# Patient Record
Sex: Male | Born: 1990 | Race: White | Hispanic: No | Marital: Single | Smoking: Former smoker
Health system: Southern US, Community
[De-identification: ages and names within clinical notes are randomized; demographics above are authoritative.]

## PROBLEM LIST (undated history)

## (undated) HISTORY — PX: APPENDECTOMY: SHX54

---

## 2014-12-23 ENCOUNTER — Encounter (HOSPITAL_COMMUNITY): Payer: Self-pay | Admitting: *Deleted

## 2014-12-23 ENCOUNTER — Emergency Department (HOSPITAL_COMMUNITY)
Admission: EM | Admit: 2014-12-23 | Discharge: 2014-12-23 | Disposition: A | Discharge status: Home / Self Care | Payer: 59 | Attending: Emergency Medicine | Admitting: Emergency Medicine

## 2014-12-23 DIAGNOSIS — R6883 Chills (without fever): Secondary | ICD-10-CM | POA: Insufficient documentation

## 2014-12-23 DIAGNOSIS — R112 Nausea with vomiting, unspecified: Secondary | ICD-10-CM | POA: Insufficient documentation

## 2014-12-23 DIAGNOSIS — Z720 Tobacco use: Secondary | ICD-10-CM | POA: Insufficient documentation

## 2014-12-23 DIAGNOSIS — R1084 Generalized abdominal pain: Secondary | ICD-10-CM | POA: Insufficient documentation

## 2014-12-23 DIAGNOSIS — R630 Anorexia: Secondary | ICD-10-CM | POA: Diagnosis not present

## 2014-12-23 DIAGNOSIS — Z880 Allergy status to penicillin: Secondary | ICD-10-CM | POA: Insufficient documentation

## 2014-12-23 DIAGNOSIS — R1110 Vomiting, unspecified: Secondary | ICD-10-CM

## 2014-12-23 DIAGNOSIS — R197 Diarrhea, unspecified: Secondary | ICD-10-CM | POA: Diagnosis not present

## 2014-12-23 LAB — CBC WITH DIFFERENTIAL/PLATELET
BASOS ABS: 0 10*3/uL (ref 0.0–0.1)
BASOS PCT: 0 % (ref 0–1)
Eosinophils Absolute: 0.2 10*3/uL (ref 0.0–0.7)
Eosinophils Relative: 1 % (ref 0–5)
HCT: 49.3 % (ref 39.0–52.0)
Hemoglobin: 17.3 g/dL — ABNORMAL HIGH (ref 13.0–17.0)
Lymphocytes Relative: 10 % — ABNORMAL LOW (ref 12–46)
Lymphs Abs: 1.6 10*3/uL (ref 0.7–4.0)
MCH: 32.3 pg (ref 26.0–34.0)
MCHC: 35.1 g/dL (ref 30.0–36.0)
MCV: 92.1 fL (ref 78.0–100.0)
Monocytes Absolute: 0.9 10*3/uL (ref 0.1–1.0)
Monocytes Relative: 6 % (ref 3–12)
Neutro Abs: 12.8 10*3/uL — ABNORMAL HIGH (ref 1.7–7.7)
Neutrophils Relative %: 83 % — ABNORMAL HIGH (ref 43–77)
PLATELETS: 311 10*3/uL (ref 150–400)
RBC: 5.35 MIL/uL (ref 4.22–5.81)
RDW: 12.5 % (ref 11.5–15.5)
WBC: 15.4 10*3/uL — ABNORMAL HIGH (ref 4.0–10.5)

## 2014-12-23 LAB — COMPREHENSIVE METABOLIC PANEL
ALT: 20 U/L (ref 0–53)
AST: 27 U/L (ref 0–37)
Albumin: 5.5 g/dL — ABNORMAL HIGH (ref 3.5–5.2)
Alkaline Phosphatase: 106 U/L (ref 39–117)
Anion gap: 18 — ABNORMAL HIGH (ref 5–15)
BUN: 13 mg/dL (ref 6–23)
CO2: 19 mmol/L (ref 19–32)
Calcium: 10.6 mg/dL — ABNORMAL HIGH (ref 8.4–10.5)
Chloride: 98 mmol/L (ref 96–112)
Creatinine, Ser: 1.03 mg/dL (ref 0.50–1.35)
GFR calc Af Amer: >90 mL/min (ref >90)
GFR calc non Af Amer: >90 mL/min (ref >90)
GLUCOSE: 136 mg/dL — AB (ref 70–99)
POTASSIUM: 3.2 mmol/L — AB (ref 3.5–5.1)
SODIUM: 135 mmol/L (ref 135–145)
Total Bilirubin: 1 mg/dL (ref 0.3–1.2)
Total Protein: 8.3 g/dL (ref 6.0–8.3)

## 2014-12-23 LAB — LIPASE, BLOOD: Lipase: 23 U/L (ref 11–59)

## 2014-12-23 MED ORDER — ONDANSETRON 4 MG PO TBDP
ORAL_TABLET | ORAL | Status: AC
  Filled 2014-12-23: qty 1

## 2014-12-23 MED ORDER — SODIUM CHLORIDE 0.9 % IV BOLUS (SEPSIS)
1000.0000 mL | Freq: Once | INTRAVENOUS | Status: AC
  Administered 2014-12-23: 1000 mL via INTRAVENOUS

## 2014-12-23 MED ORDER — ONDANSETRON 4 MG PO TBDP
4.0000 mg | ORAL_TABLET | Freq: Once | ORAL | Status: AC
  Administered 2014-12-23: 4 mg via ORAL

## 2014-12-23 MED ORDER — ONDANSETRON 4 MG PO TBDP: ORAL_TABLET | ORAL | Status: DC

## 2014-12-23 MED ORDER — ONDANSETRON HCL 4 MG/2ML IJ SOLN
4.0000 mg | Freq: Once | INTRAMUSCULAR | Status: AC
  Administered 2014-12-23: 4 mg via INTRAVENOUS
  Filled 2014-12-23: qty 2

## 2014-12-23 NOTE — ED Notes (Signed)
Pt yelling and laying in floor of triage room, asked multiple times to stay in chair and not lay in the floor, pt refusing to get out of the floor at this time.

## 2014-12-23 NOTE — ED Provider Notes (Signed)
CSN: [id number]     Arrival date & time 12/23/14  0128 History   First MD Initiated Contact with Patient 12/23/14 0425     Chief Complaint  Patient presents with  . N/V/D      (Consider location/radiation/quality/duration/timing/severity/associated sxs/prior Treatment) Patient is a 24 y.o. male presenting with abdominal pain.  Abdominal Pain Pain location:  Generalized Pain quality: cramping   Pain radiates to:  Does not radiate Pain severity:  Moderate Onset quality:  Gradual Duration:  1 day Timing:  Constant Progression:  Unchanged Chronicity:  New Context: suspicious food intake (24 hours ago)   Context: not sick contacts   Relieved by:  Nothing Worsened by:  Nothing tried Ineffective treatments:  None tried Associated symptoms: anorexia, belching, chills, diarrhea, nausea and vomiting   Associated symptoms: no chest pain and no fever     History reviewed. No pertinent past medical history. History reviewed. No pertinent past surgical history. History reviewed. No pertinent family history. History  Substance Use Topics  . Smoking status: Current Every Day Smoker  . Smokeless tobacco: Not on file  . Alcohol Use: Not on file    Review of Systems  Constitutional: Positive for chills. Negative for fever.  Cardiovascular: Negative for chest pain.  Gastrointestinal: Positive for nausea, vomiting, abdominal pain, diarrhea and anorexia.  All other systems reviewed and are negative.     Allergies  Amoxicillin  Home Medications   Prior to Admission medications   Medication Sig Start Date End Date Taking? Authorizing Provider  ondansetron (ZOFRAN ODT) 4 MG disintegrating tablet  ODT q4 hours prn nausea/vomit 12/23/14   Mirian Mo, MD   BP 120/76 mmHg  Pulse 90  Temp(Src) 99.5 F (37.5 C) (Oral)  Resp 14  Wt 140 lb (63.504 kg)  SpO2 97% Physical Exam  Constitutional: He is oriented to person, place, and time. He appears well-developed and  well-nourished.  HENT:  Head: Normocephalic and atraumatic.  Eyes: Conjunctivae and EOM are normal.  Neck: Normal range of motion. Neck supple.  Cardiovascular: Normal rate, regular rhythm and normal heart sounds.   Pulmonary/Chest: Effort normal and breath sounds normal. No respiratory distress.  Abdominal: He exhibits no distension. There is no tenderness. There is no rebound and no guarding.  Musculoskeletal: Normal range of motion.  Neurological: He is alert and oriented to person, place, and time.  Skin: Skin is warm and dry.  Vitals reviewed.   ED Course  Procedures (including critical care time) Labs Review Labs Reviewed  CBC WITH DIFFERENTIAL/PLATELET - Abnormal; Notable for the following:    WBC 15.4 (*)    Hemoglobin 17.3 (*)    Neutrophils Relative % 83 (*)    Neutro Abs 12.8 (*)    Lymphocytes Relative 10 (*)    All other components within normal limits  COMPREHENSIVE METABOLIC PANEL - Abnormal; Notable for the following:    Potassium 3.2 (*)    Glucose, Bld 136 (*)    Calcium 10.6 (*)    Albumin 5.5 (*)    Anion gap 18 (*)    All other components within normal limits  LIPASE, BLOOD    Imaging Review No results found.   EKG Interpretation None      MDM   Final diagnoses:  Vomiting and diarrhea    24 y.o. male without pertinent PMH presents with crampy abdominal pain, nausea, vomiting, and diarrhea. On arrival today the patient has vital signs and physical exam as above. He was initially tachycardic, however was  given a normal saline bolus and Zofran, with total relief of symptoms. Labs as above reassuring with exception of the leukocytosis.  As patient had relief of symptoms, reassuring workup, feel him stable to discharge home. He was given a small amount of Zofran. Discharged home in stable condition.  I have reviewed all laboratory and imaging studies if ordered as above  1. Vomiting and diarrhea         Mirian MoMatthew Gentry, MD 12/23/14 [phone]

## 2014-12-23 NOTE — ED Notes (Signed)
Pt in c/o n/v/d for the last few hours, pt hyperventilating in triage, c/o bilateral hand numbness, explained to slow breathing down

## 2014-12-23 NOTE — ED Notes (Signed)
Patient is resting comfortably. 

## 2014-12-23 NOTE — Discharge Instructions (Signed)

## 2019-03-09 ENCOUNTER — Other Ambulatory Visit: Payer: Self-pay

## 2019-03-09 ENCOUNTER — Encounter (HOSPITAL_COMMUNITY): Payer: Self-pay | Admitting: *Deleted

## 2019-03-09 ENCOUNTER — Ambulatory Visit (HOSPITAL_COMMUNITY)
Admission: EM | Admit: 2019-03-09 | Discharge: 2019-03-09 | Disposition: A | Discharge status: Home / Self Care | Payer: 59 | Attending: Physician Assistant | Admitting: Physician Assistant

## 2019-03-09 DIAGNOSIS — J0140 Acute pansinusitis, unspecified: Secondary | ICD-10-CM

## 2019-03-09 MED ORDER — DOXYCYCLINE HYCLATE 100 MG PO CAPS: 100.0000 mg | ORAL_CAPSULE | Freq: Two times a day (BID) | ORAL | 0 refills | Status: DC

## 2019-03-09 MED ORDER — FLUTICASONE PROPIONATE 50 MCG/ACT NA SUSP: 2.0000 | Freq: Every day | NASAL | 0 refills | Status: DC

## 2019-03-09 MED ORDER — PREDNISONE 50 MG PO TABS: 50.0000 mg | ORAL_TABLET | Freq: Every day | ORAL | 0 refills | Status: DC

## 2019-03-09 NOTE — Discharge Instructions (Signed)
Start flonase and prednisone as directed. Start an over the counter allergy medicine such as zyrtec. Keep hydrated, urine should be clear to pale yellow in color. If symptoms still not improving in 3 days, can fill doxycycline for possible bacterial sinus infection.    As discussed, currently symptoms most likely due to allergies. If develop cold symptoms such as cough, fever, chills, body aches, please self quarantine for 7 days since symptoms started AND more than 72 hours of no fever and cough prior to leaving the house. You can call COVID hotline [phone]) or use [facility] online if you have questions, or symptoms worsens to determine where you should seek care. If experiencing shortness of breath, trouble breathing, call 911 and provide them with your current situation.

## 2019-03-09 NOTE — ED Triage Notes (Addendum)
C/O sinus pressure since early June with slight congestion. Started with diarrhea 3 days ago; seems to have resolved as of last night. Denies fever. Also c/o malaise

## 2019-03-09 NOTE — ED Provider Notes (Signed)
[facility]    CSN: [id number] Arrival date & time: 03/09/19  1316     History   Chief Complaint Chief Complaint  Patient presents with  . Appointment    1320  . Diarrhea  . Facial Pain    HPI Craig BucksJoshua I Barnes is a 28 y.o. male.   28 year old male comes in for 4 to 6-week history of sinus pressure.  States first few days, has some nausea, vomiting, sore throat, "salivary gland swelling".  He took leftover doxycycline twice a day for 5 days which resolved those symptoms.  He then started developing sinus pressure, postnasal drip, ear popping, ear pain.  He states he has cough at baseline due to smoking, but denies any worsening.  Denies shortness of breath.  Denies loss of taste or smell.  States a few days ago started having diarrhea, but then resolved.  Denies fever, chills, night sweats.  He does note that he has been running outside more often, and has had more sneezing, malaise.  He took Benadryl with the most relief.  Mucinex with some relief.  No obvious sick contact.  Negative COVID contact.     History reviewed. No pertinent past medical history.  There are no active problems to display for this patient.   Past Surgical History:  Procedure Laterality Date  . APPENDECTOMY         Home Medications    Prior to Admission medications   Medication Sig Start Date End Date Taking? Authorizing Provider  doxycycline (VIBRAMYCIN) 100 MG capsule Take 1 capsule (100 mg total) by mouth 2 (two) times daily. 03/09/19   Cathie HoopsYu, Ayaana Biondo V, PA-C  fluticasone (FLONASE) 50 MCG/ACT nasal spray Place 2 sprays into both nostrils daily. 03/09/19   Cathie HoopsYu, Geena Weinhold V, PA-C  predniSONE (DELTASONE) 50 MG tablet Take 1 tablet (50 mg total) by mouth daily. 03/09/19   Belinda FisherYu, Georgina Krist V, PA-C    Family History Family History  Problem Relation Age of Onset  . Healthy Mother     Social History Social History   Tobacco Use  . Smoking status: Former Smoker  Substance Use Topics  . Alcohol use: Yes   Comment: 2-3x/wk  . Drug use: Yes    Types: Marijuana     Allergies   Amoxicillin   Review of Systems Review of Systems  Reason unable to perform ROS: See HPI as above.     Physical Exam Triage Vital Signs ED Triage Vitals  Enc Vitals Group     BP 03/09/19 1337 (!) 145/95     Pulse Rate 03/09/19 1337 94     Resp 03/09/19 1337 18     Temp 03/09/19 1346 98.8 F (37.1 C)     Temp Source 03/09/19 1346 Temporal     SpO2 03/09/19 1337 100 %     Weight --      Height --      Head Circumference --      Peak Flow --      Pain Score 03/09/19 1339 0     Pain Loc --      Pain Edu? --      Excl. in [place]? --    No data found.  Updated Vital Signs BP (!) 145/95   Pulse 94   Temp 98.8 F (37.1 C) (Temporal)   Resp 18   SpO2 100%   Physical Exam Constitutional:      General: He is not in acute distress.    Appearance: Normal  appearance. He is not ill-appearing, toxic-appearing or diaphoretic.  HENT:     Head: Normocephalic and atraumatic.     Right Ear: Ear canal and external ear normal. Tympanic membrane is not erythematous or bulging.     Left Ear: Ear canal and external ear normal. Tympanic membrane is erythematous. Tympanic membrane is not bulging.     Nose:     Right Sinus: No maxillary sinus tenderness or frontal sinus tenderness.     Left Sinus: No maxillary sinus tenderness or frontal sinus tenderness.     Mouth/Throat:     Mouth: Mucous membranes are moist.     Pharynx: Oropharynx is clear. Uvula midline.  Neck:     Musculoskeletal: Normal range of motion and neck supple.  Cardiovascular:     Rate and Rhythm: Normal rate and regular rhythm.     Heart sounds: Normal heart sounds. No murmur. No friction rub. No gallop.   Pulmonary:     Effort: Pulmonary effort is normal. No accessory muscle usage, prolonged expiration, respiratory distress or retractions.     Comments: Lungs clear to auscultation without adventitious lung sounds. Neurological:     General: No  focal deficit present.     Mental Status: He is alert and oriented to person, place, and time.      UC Treatments / Results  Labs (all labs ordered are listed, but only abnormal results are displayed) Labs Reviewed - No data to display  EKG   Radiology No results found.  Procedures Procedures (including critical care time)  Medications Ordered in UC Medications - No data to display  Initial Impression / Assessment and Plan / UC Course  I have reviewed the triage vital signs and the nursing notes.  Pertinent labs & imaging results that were available during my care of the patient were reviewed by me and considered in my medical decision making (see chart for details).    Discussed more likely allergies causing symptoms vs bacterial sinusitis. Will start prednisone, flonase, antihistamine at this time. Written Rx of doxycycline provided to patient, can fill if symptoms not improving to cover for bacterial sinusitis. Continue to monitor symptoms. Discussed if develop more viral symptoms such as worsening cough, fever, chills, body aches, to self quarantine. Self quarantine guidelines discussed. Return precautions given. Patient expresses understanding and agrees to plan.  Final Clinical Impressions(s) / UC Diagnoses   Final diagnoses:  Acute non-recurrent pansinusitis    ED Prescriptions    Medication Sig Dispense Auth. Provider   fluticasone (FLONASE) 50 MCG/ACT nasal spray Place 2 sprays into both nostrils daily. 1 g Tiney Zipper V, PA-C   predniSONE (DELTASONE) 50 MG tablet Take 1 tablet (50 mg total) by mouth daily. 5 tablet Epifanio Labrador V, PA-C   doxycycline (VIBRAMYCIN) 100 MG capsule Take 1 capsule (100 mg total) by mouth 2 (two) times daily. 20 capsule Tobin Chad, [place] 03/09/19 1433

## 2019-06-16 ENCOUNTER — Other Ambulatory Visit: Payer: Self-pay

## 2019-06-16 DIAGNOSIS — Z20822 Contact with and (suspected) exposure to covid-19: Secondary | ICD-10-CM

## 2019-06-18 LAB — NOVEL CORONAVIRUS, NAA: SARS-CoV-2, NAA: NOT DETECTED

## 2019-07-04 ENCOUNTER — Other Ambulatory Visit: Payer: Self-pay

## 2019-07-04 DIAGNOSIS — Z20822 Contact with and (suspected) exposure to covid-19: Secondary | ICD-10-CM

## 2019-07-06 LAB — NOVEL CORONAVIRUS, NAA: SARS-CoV-2, NAA: NOT DETECTED

## 2019-09-28 ENCOUNTER — Other Ambulatory Visit: Payer: Self-pay

## 2019-09-28 ENCOUNTER — Emergency Department (HOSPITAL_COMMUNITY)
Admission: EM | Admit: 2019-09-28 | Discharge: 2019-09-28 | Disposition: A | Discharge status: Home / Self Care | Payer: 59 | Attending: Emergency Medicine | Admitting: Emergency Medicine

## 2019-09-28 ENCOUNTER — Emergency Department (HOSPITAL_COMMUNITY): Payer: 59

## 2019-09-28 ENCOUNTER — Encounter (HOSPITAL_COMMUNITY): Payer: Self-pay

## 2019-09-28 DIAGNOSIS — S82002A Unspecified fracture of left patella, initial encounter for closed fracture: Secondary | ICD-10-CM | POA: Diagnosis not present

## 2019-09-28 DIAGNOSIS — Z87891 Personal history of nicotine dependence: Secondary | ICD-10-CM | POA: Diagnosis not present

## 2019-09-28 DIAGNOSIS — Y999 Unspecified external cause status: Secondary | ICD-10-CM | POA: Diagnosis not present

## 2019-09-28 DIAGNOSIS — Y939 Activity, unspecified: Secondary | ICD-10-CM | POA: Insufficient documentation

## 2019-09-28 DIAGNOSIS — Y929 Unspecified place or not applicable: Secondary | ICD-10-CM | POA: Diagnosis not present

## 2019-09-28 DIAGNOSIS — W009XXA Unspecified fall due to ice and snow, initial encounter: Secondary | ICD-10-CM | POA: Diagnosis not present

## 2019-09-28 DIAGNOSIS — S8992XA Unspecified injury of left lower leg, initial encounter: Secondary | ICD-10-CM | POA: Diagnosis present

## 2019-09-28 DIAGNOSIS — W19XXXA Unspecified fall, initial encounter: Secondary | ICD-10-CM

## 2019-09-28 MED ORDER — NAPROXEN 375 MG PO TABS: 375.0000 mg | ORAL_TABLET | Freq: Two times a day (BID) | ORAL | 0 refills | Status: DC

## 2019-09-28 MED ORDER — OXYCODONE-ACETAMINOPHEN 5-325 MG PO TABS
1.0000 | ORAL_TABLET | Freq: Once | ORAL | Status: AC
  Administered 2019-09-28: 1 via ORAL
  Filled 2019-09-28: qty 1

## 2019-09-28 MED ORDER — OXYCODONE-ACETAMINOPHEN 5-325 MG PO TABS: 1.0000 | ORAL_TABLET | ORAL | 0 refills | Status: DC | PRN

## 2019-09-28 MED ORDER — NAPROXEN 375 MG PO TABS: 375.0000 mg | ORAL_TABLET | Freq: Two times a day (BID) | ORAL | 0 refills | Status: AC

## 2019-09-28 MED ORDER — KETOROLAC TROMETHAMINE 30 MG/ML IJ SOLN
30.0000 mg | Freq: Once | INTRAMUSCULAR | Status: AC
  Administered 2019-09-28: 30 mg via INTRAMUSCULAR
  Filled 2019-09-28: qty 1

## 2019-09-28 MED ORDER — OXYCODONE-ACETAMINOPHEN 5-325 MG PO TABS
1.0000 | ORAL_TABLET | Freq: Once | ORAL | Status: AC
  Administered 2019-09-28: 17:00:00 1 via ORAL
  Filled 2019-09-28: qty 1

## 2019-09-28 NOTE — Progress Notes (Signed)
Orthopedic Tech Progress Note Patient Details:  Craig Barnes [birth year] [id number] The PA came by and told me that the MD wanted a watson jones dressing to patient knee.. so she held while I applied the dressing. Ortho Devices Type of Ortho Device: Cotton web roll, Crutches, Ace wrap, Knee Immobilizer Ortho Device/Splint Location: LLE Ortho Device/Splint Interventions: Application, Ordered, Adjustment   Post Interventions Patient Tolerated: Well Instructions Provided: Care of device, Adjustment of device   [name] 09/28/2019, 6:20 PM

## 2019-09-28 NOTE — Discharge Instructions (Addendum)
Your x-ray today showed a fracture of your patella.  We placed you in a bulky dressing along with a knee immobilizer, you will need to ambulate with your crutches.  Please call Dr. Boneta Lucks office tomorrow in order to obtain the time in which you will see him in office on Tuesday.  I have prescribed medication to help with your pain, please take it for severe pain.

## 2019-09-28 NOTE — ED Triage Notes (Signed)
Pt reports he slipt on some ice and landed on his left knee yesterday. 09/27/2019

## 2019-09-28 NOTE — ED Provider Notes (Signed)
[place] [facility] Provider Note   CSN: [id number] Arrival date & time: 09/28/19  1611     History Chief Complaint  Patient presents with  . Knee Injury    Craig Barnes is a 29 y.o. male.  29 y.o male with no PMH presents to the ED with a chief complaint of left knee pain x last night. Patient reports he was relating when he slipped and fell on ice, he reports the left knee landed on the ice.  He reports pain to the area, he was able to sleep this off but reports after the alcohol in his system wore off the pain increased in nature.  He has not taken any medication for improvement in his symptoms.  Patient has been ambulatory minimally weightbearing on left knee.  He reports there is pain with ambulation along with leg extension.  No other trauma, injury.  The history is provided by the patient.        Past Surgical History:  Procedure Laterality Date  . APPENDECTOMY         Family History  Problem Relation Age of Onset  . Healthy Mother     Social History   Tobacco Use  . Smoking status: Former Smoker    Quit date: 2018    Years since quitting: 3.0  Substance Use Topics  . Alcohol use: Yes    Comment: 2-3x/wk  . Drug use: Yes    Types: Marijuana    Home Medications Prior to Admission medications   Medication Sig Start Date End Date Taking? Authorizing Provider  doxycycline (VIBRAMYCIN) 100 MG capsule Take 1 capsule (100 mg total) by mouth 2 (two) times daily. 03/09/19   Tasia Catchings, Amy V, PA-C  fluticasone (FLONASE) 50 MCG/ACT nasal spray Place 2 sprays into both nostrils daily. 03/09/19   Tasia Catchings, Amy V, PA-C  naproxen (NAPROSYN) 375 MG tablet Take 1 tablet (375 mg total) by mouth 2 (two) times daily for 7 days. 09/28/19 10/05/19  Janeece Fitting, PA-C  oxyCODONE-acetaminophen (PERCOCET/ROXICET) 5-325 MG tablet Take 1 tablet by mouth every 4 (four) hours as needed for up to 3 days for severe pain. 09/28/19 10/01/19  Janeece Fitting, PA-C  predniSONE  (DELTASONE) 50 MG tablet Take 1 tablet (50 mg total) by mouth daily. 03/09/19   Ok Edwards, PA-C    Allergies    Amoxicillin  Review of Systems   Review of Systems  Constitutional: Negative for fever.  HENT: Negative for sore throat.   Respiratory: Negative for stridor.   Cardiovascular: Negative for chest pain.  Gastrointestinal: Negative for abdominal pain.  Genitourinary: Negative for flank pain.  Musculoskeletal: Positive for arthralgias and joint swelling.  Skin: Negative for pallor and wound.    Physical Exam Updated Vital Signs BP (!) 127/92 (BP Location: Right Arm)   Pulse 85   Temp 99 F (37.2 C) (Oral)   Resp 20   Ht 6\' 1"  (1.854 m)   Wt 68 kg   SpO2 100%   BMI 19.79 kg/m   Physical Exam Vitals and nursing note reviewed.  Constitutional:      Appearance: Normal appearance.  HENT:     Head: Normocephalic and atraumatic.     Mouth/Throat:     Mouth: Mucous membranes are moist.  Eyes:     Pupils: Pupils are equal, round, and reactive to light.  Cardiovascular:     Rate and Rhythm: Normal rate.  Pulmonary:     Effort: Pulmonary effort is  normal.  Abdominal:     General: Abdomen is flat.  Musculoskeletal:        General: Swelling, tenderness, deformity and signs of injury present.     Cervical back: Normal range of motion and neck supple.     Left knee: Swelling, deformity and erythema present. Decreased range of motion.     Comments: Please see photo attached.  Skin:    General: Skin is warm and dry.  Neurological:     Mental Status: He is alert and oriented to person, place, and time.       ED Results / Procedures / Treatments   Labs (all labs ordered are listed, but only abnormal results are displayed) Labs Reviewed - No data to display  EKG None  Radiology DG Knee 2 Views Left  Result Date: 09/28/2019 CLINICAL DATA:  Fall on ice. Severe left knee pain and swelling. Initial encounter. EXAM: LEFT KNEE - 2 VIEW COMPARISON:  None. FINDINGS:  Large lipohemarthrosis is seen. A mildly displaced fracture is seen involving the inferior aspect of the patella. No evidence of dislocation. No other osseous abnormality identified. IMPRESSION: Mildly displaced fracture through the inferior patella, with large lipohemarthrosis. Electronically Signed   By: Danae Orleans M.D.   On: 09/28/2019 17:15    Procedures Procedures (including critical care time)  Medications Ordered in ED Medications  oxyCODONE-acetaminophen (PERCOCET/ROXICET) 5-325 MG per tablet 1 tablet (1 tablet Oral Given 09/28/19 1719)  ketorolac (TORADOL) 30 MG/ML injection 30 mg (30 mg Intramuscular Given 09/28/19 1816)  oxyCODONE-acetaminophen (PERCOCET/ROXICET) 5-325 MG per tablet 1 tablet (1 tablet Oral Given 09/28/19 1818)    ED Course  I have reviewed the triage vital signs and the nursing notes.  Pertinent labs & imaging results that were available during my care of the patient were reviewed by me and considered in my medical decision making (see chart for details).    MDM Rules/Calculators/A&P   Patient with no pertinent past medical history presents to the ED status post fall, tripped on ice yesterday.  Has had pain to the left knee, has been ambulating but without putting much weight on the left leg.  He reports swelling, erythema, limited range of motion to the left knee.  Patient was evaluated in the ED, none toxic appearing, appears in significant discomfort, obvious deformity noted.  Please see photo attached.  There is significant tenderness to palpation along the upper patellar region, significant knee effusion present.  Pulses are present, neurovascularly intact.  X-ray of the knee showed  Xray of the left knee showed: Mildly displaced fracture through the inferior patella, with large  lipohemarthrosis.      Call placed to orthopedics for further recommendations.  05:55 PM spoke to Dr. Charlann Boxer orthopedist who recommended bulky dressing, knee immobilizer along  with crutches.  Patient will need to follow-up in an outpatient basis with him in office on Tuesday.  6:19 PM I personally helped place lysing patient, he tolerated the procedure well.  He will go home on a short course of Percocet to help with his pain, encouraged to follow-up with Dr. Charlann Boxer in office on Tuesday.  Patient understands and agrees with management, return precautions discussed at length.  Portions of this note were generated with Scientist, clinical (histocompatibility and immunogenetics). Dictation errors may occur despite best attempts at proofreading.  Final Clinical Impression(s) / ED Diagnoses Final diagnoses:  Closed displaced fracture of left patella, unspecified fracture morphology, initial encounter  Fall, initial encounter    Rx / DC Orders  ED Discharge Orders         Ordered    oxyCODONE-acetaminophen (PERCOCET/ROXICET) 5-325 MG tablet  Every 4 hours PRN     09/28/19 1820    naproxen (NAPROSYN) 375 MG tablet  2 times daily     09/28/19 1821           Claude Manges, PA-C 09/28/19 1821    Milagros Loll, MD 09/28/19 [phone]

## 2019-09-30 ENCOUNTER — Encounter (HOSPITAL_COMMUNITY): Payer: Self-pay | Admitting: Emergency Medicine

## 2019-09-30 ENCOUNTER — Ambulatory Visit (HOSPITAL_COMMUNITY)
Admission: EM | Admit: 2019-09-30 | Discharge: 2019-09-30 | Disposition: A | Discharge status: Home / Self Care | Payer: 59 | Attending: Family Medicine | Admitting: Family Medicine

## 2019-09-30 ENCOUNTER — Other Ambulatory Visit: Payer: Self-pay

## 2019-09-30 DIAGNOSIS — S82045D Nondisplaced comminuted fracture of left patella, subsequent encounter for closed fracture with routine healing: Secondary | ICD-10-CM

## 2019-09-30 MED ORDER — OXYCODONE-ACETAMINOPHEN 5-325 MG PO TABS: 1.0000 | ORAL_TABLET | ORAL | 0 refills | Status: AC | PRN

## 2019-09-30 NOTE — ED Provider Notes (Signed)
[facility]    CSN: [id number] Arrival date & time: 09/30/19  1218      History   Chief Complaint Chief Complaint  Patient presents with  . Pain    HPI Craig Barnes is a 29 y.o. male.   HPI   [place] on Jan 31 Fractured his patella Seen in the ER Was told to follow up today with ortho He was unable to secure an appt Is out of percocet  History reviewed. No pertinent past medical history.  There are no problems to display for this patient.   Past Surgical History:  Procedure Laterality Date  . APPENDECTOMY         Home Medications    Prior to Admission medications   Medication Sig Start Date End Date Taking? Authorizing Provider  naproxen (NAPROSYN) 375 MG tablet Take 1 tablet (375 mg total) by mouth 2 (two) times daily for 7 days. 09/28/19 10/05/19  Lucrezia Starch, MD  oxyCODONE-acetaminophen (PERCOCET/ROXICET) 5-325 MG tablet Take 1 tablet by mouth every 4 (four) hours as needed for up to 5 days for severe pain. 09/30/19 10/05/19  Raylene Everts, MD  fluticasone (FLONASE) 50 MCG/ACT nasal spray Place 2 sprays into both nostrils daily. 03/09/19 09/30/19  Ok Edwards, PA-C    Family History Family History  Problem Relation Age of Onset  . Healthy Mother     Social History Social History   Tobacco Use  . Smoking status: Former Smoker    Quit date: 2018    Years since quitting: 3.0  Substance Use Topics  . Alcohol use: Yes    Comment: 2-3x/wk  . Drug use: Yes    Types: Marijuana     Allergies   Amoxicillin   Review of Systems Review of Systems  Musculoskeletal: Positive for arthralgias, gait problem and joint swelling.     Physical Exam Triage Vital Signs ED Triage Vitals  Enc Vitals Group     BP 09/30/19 1253 124/84     Pulse Rate 09/30/19 1253 82     Resp 09/30/19 1253 18     Temp 09/30/19 1253 98.2 F (36.8 C)     Temp Source 09/30/19 1253 Oral     SpO2 09/30/19 1253 100 %     Weight --      Height --      Head  Circumference --      Peak Flow --      Pain Score 09/30/19 1259 3     Pain Loc --      Pain Edu? --      Excl. in [place]? --    No data found.  Updated Vital Signs BP 124/84 (BP Location: Left Arm)   Pulse 82   Temp 98.2 F (36.8 C) (Oral)   Resp 18   SpO2 100% :     Physical Exam Constitutional:      General: He is not in acute distress.    Appearance: He is well-developed.  HENT:     Head: Normocephalic and atraumatic.  Eyes:     Conjunctiva/sclera: Conjunctivae normal.     Pupils: Pupils are equal, round, and reactive to light.  Cardiovascular:     Rate and Rhythm: Normal rate.  Pulmonary:     Effort: Pulmonary effort is normal. No respiratory distress.  Abdominal:     General: There is no distension.  Musculoskeletal:        General: Normal range of motion.  Cervical back: Normal range of motion.     Comments: Knee immobilizer in place  Skin:    General: Skin is warm and dry.  Neurological:     Mental Status: He is alert. Mental status is at baseline.  Psychiatric:        Mood and Affect: Mood normal.        Behavior: Behavior normal.      UC Treatments / Results  Labs (all labs ordered are listed, but only abnormal results are displayed) Labs Reviewed - No data to display  EKG   Radiology DG Knee 2 Views Left  Result Date: 09/28/2019 CLINICAL DATA:  Fall on ice. Severe left knee pain and swelling. Initial encounter. EXAM: LEFT KNEE - 2 VIEW COMPARISON:  None. FINDINGS: Large lipohemarthrosis is seen. A mildly displaced fracture is seen involving the inferior aspect of the patella. No evidence of dislocation. No other osseous abnormality identified. IMPRESSION: Mildly displaced fracture through the inferior patella, with large lipohemarthrosis. Electronically Signed   By: Danae Orleans M.D.   On: 09/28/2019 17:15    Procedures Procedures (including critical care time)  Medications Ordered in UC Medications - No data to display  Initial Impression  / Assessment and Plan / UC Course  I have reviewed the triage vital signs and the nursing notes.  Pertinent labs & imaging results that were available during my care of the patient were reviewed by me and considered in my medical decision making (see chart for details).     Called [facility], sot appt for today Final Clinical Impressions(s) / UC Diagnoses   Final diagnoses:  Closed nondisplaced comminuted fracture of left patella with routine healing, subsequent encounter     Discharge Instructions     NO WEIGHT ON LEG DO NOT REMOVE BRACE PAIN MEDICINE AS NEEDED SEE DR. OLIN AT 2:45 TODAY   ED Prescriptions    Medication Sig Dispense Auth. Provider   oxyCODONE-acetaminophen (PERCOCET/ROXICET) 5-325 MG tablet Take 1 tablet by mouth every 4 (four) hours as needed for up to 5 days for severe pain. 20 tablet Eustace Moore, MD     I have reviewed the PDMP during this encounter.   Eustace Moore, MD 09/30/19 [phone]

## 2019-09-30 NOTE — Discharge Instructions (Signed)
NO WEIGHT ON LEG DO NOT REMOVE BRACE PAIN MEDICINE AS NEEDED SEE DR. OLIN AT 2:45 TODAY

## 2019-09-30 NOTE — ED Triage Notes (Signed)
Patient seen in ED on 09/28/2019.  Patient cannot be seen at specialist .  Office to call patient tomorrow.  Patient is out of pain medication.  Patient is needing medication for pain

## 2019-10-02 ENCOUNTER — Other Ambulatory Visit (HOSPITAL_COMMUNITY)
Admission: RE | Admit: 2019-10-02 | Discharge: 2019-10-02 | Disposition: A | Discharge status: Home / Self Care | Payer: 59 | Source: Ambulatory Visit | Attending: Orthopedic Surgery | Admitting: Orthopedic Surgery

## 2019-10-02 DIAGNOSIS — Z20822 Contact with and (suspected) exposure to covid-19: Secondary | ICD-10-CM | POA: Diagnosis not present

## 2019-10-02 DIAGNOSIS — Z01812 Encounter for preprocedural laboratory examination: Secondary | ICD-10-CM | POA: Insufficient documentation

## 2019-10-02 LAB — SARS CORONAVIRUS 2 (TAT 6-24 HRS): SARS Coronavirus 2: NEGATIVE

## 2019-10-02 NOTE — Patient Instructions (Signed)
DUE TO COVID-19 ONLY ONE VISITOR IS ALLOWED TO COME WITH YOU AND STAY IN THE WAITING ROOM ONLY DURING PRE OP AND PROCEDURE DAY OF SURGERY. THE 1 VISITOR MAY VISIT WITH YOU AFTER SURGERY IN YOUR PRIVATE ROOM DURING VISITING HOURS ONLY!  YOU NEED TO HAVE A COVID 19 TEST ON_2/4_____ @_11 :55______, THIS TEST MUST BE DONE BEFORE SURGERY, COME  [place] , [id number].  ([place]) ONCE YOUR COVID TEST IS COMPLETED, PLEASE BEGIN THE QUARANTINE INSTRUCTIONS AS OUTLINED IN YOUR HANDOUT.                Craig Barnes    Your procedure is scheduled on: 10/06/19   Report to [facility] Main  Entrance   Report to admitting at   1:00 PM.     Call this number if you have problems the morning of surgery [place], [place].   Do not eat food After Midnight.    YOU MAY HAVE CLEAR LIQUIDS FROM MIDNIGHT UNTIL 12:00 PM.     CLEAR LIQUID DIET   Foods Allowed                                                                     Foods Excluded  Coffee and tea, regular and decaf                             liquids that you cannot  Plain Jell-O any favor except red or purple                                           see through such as: Fruit ices (not with fruit pulp)                                     milk, soups, orange juice  Iced Popsicles                                    All solid food Carbonated beverages, regular and diet                                    Cranberry, grape and apple juices Sports drinks like Gatorade Lightly seasoned clear broth or consume(fat free) Sugar, honey syrup  Sample Menu Breakfast                                Lunch                                     Cranberry juice  Beef broth                             Jell-O                                     Grape juice                            Coffee or tea                         Jell-O                                                                                     Coffee or tea                          _____________________________________________________________________    At 12:00 PM Please finish the prescribed Pre-Surgery  drink.   Nothing by mouth after you finish the  drink !    Take these medicines the morning of surgery with A SIP OF WATER: none                                 You may not have any metal on your body including               piercings  Do not wear jewelry, lotions, powders or deodorant                     Men may shave face and neck.   Do not bring valuables to the hospital. [place] IS NOT             RESPONSIBLE   FOR VALUABLES.  Contacts, dentures or bridgework may not be worn into surgery.       Patients discharged the day of surgery will not be allowed to drive home  . IF YOU ARE HAVING SURGERY AND GOING HOME THE SAME DAY, YOU MUST HAVE AN ADULT TO DRIVE YOU HOME AND BE WITH YOU FOR 24 HOURS . YOU MAY GO HOME BY TAXI OR UBER OR ORTHERWISE, BUT AN ADULT MUST ACCOMPANY YOU HOME AND STAY WITH YOU FOR 24 HOURS.  Name and phone number of your driver:  Special Instructions: N/A              Please read over the following fact sheets you were given: _____________________________________________________________________             [facility] - Preparing for Surgery Before surgery, you can play an important role .  Because skin is not sterile, your skin needs to be as free of germs as possible .  You can reduce the number of germs on your skin by washing with CHG (chlorahexidine gluconate) soap before surgery.   CHG is an antiseptic cleaner which kills germs and bonds with the skin  to continue killing germs even after washing. Please DO NOT use if you have an allergy to CHG or antibacterial soaps.   If your skin becomes reddened/irritated stop using the CHG and inform your nurse when you arrive at Short  Stay. .  You may shave your face/neck.  Please follow these instructions carefully:  1.  Shower with CHG Soap the night before surgery and the  morning of Surgery.  2.  If you choose to wash your hair, wash your hair first as usual with your  normal  shampoo.  3.  After you shampoo, rinse your hair and body thoroughly to remove the  shampoo.                                        4.  Use CHG as you would any other liquid soap.  You can apply chg directly  to the skin and wash                       Gently with a scrungie or clean washcloth.  5.  Apply the CHG Soap to your body ONLY FROM THE NECK DOWN.   Do not use on face/ open                           Wound or open sores. Avoid contact with eyes, ears mouth and genitals (private parts).                       Wash face,  Genitals (private parts) with your normal soap.             6.  Wash thoroughly, paying special attention to the area where your surgery  will be performed.  7.  Thoroughly rinse your body with warm water from the neck down.  8.  DO NOT shower/wash with your normal soap after using and rinsing off  the CHG Soap.             9.  Pat yourself dry with a clean towel.            10.  Wear clean pajamas.            11.  Place clean sheets on your bed the night of your first shower and do not  sleep with pets. Day of Surgery : Do not apply any lotions/deodorants the morning of surgery.  Please wear clean clothes to the hospital/surgery center.  FAILURE TO FOLLOW THESE INSTRUCTIONS MAY RESULT IN THE CANCELLATION OF YOUR SURGERY PATIENT SIGNATURE_________________________________  NURSE SIGNATURE__________________________________  ________________________________________________________________________

## 2019-10-03 ENCOUNTER — Encounter (HOSPITAL_COMMUNITY): Payer: Self-pay

## 2019-10-03 ENCOUNTER — Encounter (HOSPITAL_COMMUNITY)
Admission: RE | Admit: 2019-10-03 | Discharge: 2019-10-03 | Disposition: A | Discharge status: Home / Self Care | Payer: 59 | Source: Ambulatory Visit | Attending: Orthopedic Surgery | Admitting: Orthopedic Surgery

## 2019-10-03 ENCOUNTER — Other Ambulatory Visit: Payer: Self-pay

## 2019-10-03 DIAGNOSIS — Z01812 Encounter for preprocedural laboratory examination: Secondary | ICD-10-CM | POA: Diagnosis present

## 2019-10-03 LAB — CBC
HCT: 41.8 % (ref 39.0–52.0)
Hemoglobin: 14.3 g/dL (ref 13.0–17.0)
MCH: 32.4 pg (ref 26.0–34.0)
MCHC: 34.2 g/dL (ref 30.0–36.0)
MCV: 94.8 fL (ref 80.0–100.0)
Platelets: 204 10*3/uL (ref 150–400)
RBC: 4.41 MIL/uL (ref 4.22–5.81)
RDW: 12.3 % (ref 11.5–15.5)
WBC: 5.5 10*3/uL (ref 4.0–10.5)
nRBC: 0 % (ref 0.0–0.2)

## 2019-10-03 NOTE — Progress Notes (Signed)
PCP - none Cardiologist - no  Chest x-ray - no EKG - no Stress Test - no ECHO - no Cardiac Cath - no  Sleep Study - no CPAP -   Fasting Blood Sugar - NA Checks Blood Sugar _____ times a day  Blood Thinner Instructions:NA Aspirin Instructions: Last Dose:  Anesthesia review:   Patient denies shortness of breath, fever, cough and chest pain at PAT appointment yes  Patient verbalized understanding of instructions that were given to them at the PAT appointment. Patient was also instructed that they will need to review over the PAT instructions again at home before surgery. yes

## 2019-10-06 ENCOUNTER — Ambulatory Visit (HOSPITAL_COMMUNITY): Payer: 59 | Admitting: Certified Registered Nurse Anesthetist

## 2019-10-06 ENCOUNTER — Encounter (HOSPITAL_COMMUNITY): Payer: Self-pay | Admitting: Orthopedic Surgery

## 2019-10-06 ENCOUNTER — Other Ambulatory Visit: Payer: Self-pay

## 2019-10-06 ENCOUNTER — Ambulatory Visit (HOSPITAL_COMMUNITY): Payer: 59

## 2019-10-06 ENCOUNTER — Ambulatory Visit (HOSPITAL_COMMUNITY)
Admission: RE | Admit: 2019-10-06 | Discharge: 2019-10-06 | Disposition: A | Discharge status: Home / Self Care | Payer: 59 | Source: Ambulatory Visit | Attending: Orthopedic Surgery | Admitting: Orthopedic Surgery

## 2019-10-06 ENCOUNTER — Encounter (HOSPITAL_COMMUNITY)
Admission: RE | Disposition: A | Discharge status: Home / Self Care | Payer: Self-pay | Source: Ambulatory Visit | Attending: Orthopedic Surgery

## 2019-10-06 ENCOUNTER — Ambulatory Visit (HOSPITAL_COMMUNITY): Payer: 59 | Admitting: Physician Assistant

## 2019-10-06 DIAGNOSIS — W000XXA Fall on same level due to ice and snow, initial encounter: Secondary | ICD-10-CM | POA: Insufficient documentation

## 2019-10-06 DIAGNOSIS — F1210 Cannabis abuse, uncomplicated: Secondary | ICD-10-CM | POA: Insufficient documentation

## 2019-10-06 DIAGNOSIS — Z87891 Personal history of nicotine dependence: Secondary | ICD-10-CM | POA: Diagnosis not present

## 2019-10-06 DIAGNOSIS — S82002A Unspecified fracture of left patella, initial encounter for closed fracture: Secondary | ICD-10-CM | POA: Diagnosis present

## 2019-10-06 DIAGNOSIS — Z419 Encounter for procedure for purposes other than remedying health state, unspecified: Secondary | ICD-10-CM

## 2019-10-06 HISTORY — PX: ORIF PATELLA: SHX5033

## 2019-10-06 LAB — TYPE AND SCREEN
ABO/RH(D): B NEG
Antibody Screen: NEGATIVE

## 2019-10-06 SURGERY — OPEN REDUCTION INTERNAL FIXATION (ORIF) PATELLA
Anesthesia: General | Site: Knee | Laterality: Left

## 2019-10-06 MED ORDER — CEFAZOLIN SODIUM-DEXTROSE 2-4 GM/100ML-% IV SOLN
2.0000 g | INTRAVENOUS | Status: AC
  Administered 2019-10-06: 16:00:00 2 g via INTRAVENOUS

## 2019-10-06 MED ORDER — HYDROMORPHONE HCL 1 MG/ML IJ SOLN
INTRAMUSCULAR | Status: AC
  Administered 2019-10-06: 18:00:00 0.5 mg via INTRAVENOUS
  Filled 2019-10-06: qty 1

## 2019-10-06 MED ORDER — OXYCODONE HCL 5 MG PO TABS
ORAL_TABLET | ORAL | Status: AC
  Filled 2019-10-06: qty 1

## 2019-10-06 MED ORDER — OXYCODONE HCL 5 MG PO TABS: 5.0000 mg | ORAL_TABLET | Freq: Once | ORAL | Status: DC | PRN

## 2019-10-06 MED ORDER — KETOROLAC TROMETHAMINE 30 MG/ML IJ SOLN
INTRAMUSCULAR | Status: AC
  Filled 2019-10-06: qty 1

## 2019-10-06 MED ORDER — DEXAMETHASONE SODIUM PHOSPHATE 10 MG/ML IJ SOLN
INTRAMUSCULAR | Status: AC
  Filled 2019-10-06: qty 1

## 2019-10-06 MED ORDER — ONDANSETRON HCL 4 MG/2ML IJ SOLN
INTRAMUSCULAR | Status: DC | PRN
  Administered 2019-10-06: 4 mg via INTRAVENOUS

## 2019-10-06 MED ORDER — METHOCARBAMOL 500 MG PO TABS: 500.0000 mg | ORAL_TABLET | Freq: Four times a day (QID) | ORAL | 0 refills | Status: DC | PRN

## 2019-10-06 MED ORDER — HYDROMORPHONE HCL 1 MG/ML IJ SOLN
0.2500 mg | INTRAMUSCULAR | Status: DC | PRN
  Administered 2019-10-06: 0.5 mg via INTRAVENOUS

## 2019-10-06 MED ORDER — METHOCARBAMOL 500 MG PO TABS: 500.0000 mg | ORAL_TABLET | Freq: Four times a day (QID) | ORAL | Status: DC | PRN

## 2019-10-06 MED ORDER — FENTANYL CITRATE (PF) 100 MCG/2ML IJ SOLN
INTRAMUSCULAR | Status: AC
  Filled 2019-10-06: qty 2

## 2019-10-06 MED ORDER — PROPOFOL 10 MG/ML IV BOLUS
INTRAVENOUS | Status: AC
  Filled 2019-10-06: qty 20

## 2019-10-06 MED ORDER — CLONIDINE HCL (ANALGESIA) 100 MCG/ML EP SOLN
EPIDURAL | Status: DC | PRN
  Administered 2019-10-06: 100 ug

## 2019-10-06 MED ORDER — METHOCARBAMOL 500 MG IVPB - SIMPLE MED: 500.0000 mg | Freq: Four times a day (QID) | INTRAVENOUS | Status: DC | PRN

## 2019-10-06 MED ORDER — ASPIRIN 81 MG PO CHEW: 81.0000 mg | CHEWABLE_TABLET | Freq: Two times a day (BID) | ORAL | 0 refills | Status: DC

## 2019-10-06 MED ORDER — OXYCODONE HCL 5 MG/5ML PO SOLN: 5.0000 mg | Freq: Once | ORAL | Status: DC | PRN

## 2019-10-06 MED ORDER — ASPIRIN 81 MG PO CHEW: 81.0000 mg | CHEWABLE_TABLET | Freq: Two times a day (BID) | ORAL | 0 refills | Status: AC

## 2019-10-06 MED ORDER — CHLORHEXIDINE GLUCONATE 4 % EX LIQD: 60.0000 mL | Freq: Once | CUTANEOUS | Status: DC

## 2019-10-06 MED ORDER — CEFAZOLIN SODIUM-DEXTROSE 2-4 GM/100ML-% IV SOLN
INTRAVENOUS | Status: AC
  Filled 2019-10-06: qty 100

## 2019-10-06 MED ORDER — LACTATED RINGERS IV SOLN: INTRAVENOUS | Status: DC

## 2019-10-06 MED ORDER — BUPIVACAINE HCL 0.25 % IJ SOLN
INTRAMUSCULAR | Status: AC
  Filled 2019-10-06: qty 1

## 2019-10-06 MED ORDER — LIDOCAINE 2% (20 MG/ML) 5 ML SYRINGE
INTRAMUSCULAR | Status: DC | PRN
  Administered 2019-10-06: 100 mg via INTRAVENOUS

## 2019-10-06 MED ORDER — ONDANSETRON HCL 4 MG/2ML IJ SOLN
INTRAMUSCULAR | Status: AC
  Filled 2019-10-06: qty 2

## 2019-10-06 MED ORDER — PROMETHAZINE HCL 25 MG/ML IJ SOLN: 6.2500 mg | INTRAMUSCULAR | Status: DC | PRN

## 2019-10-06 MED ORDER — MIDAZOLAM HCL 2 MG/2ML IJ SOLN
1.0000 mg | INTRAMUSCULAR | Status: DC
  Administered 2019-10-06 (×2): 1 mg via INTRAVENOUS
  Filled 2019-10-06: qty 2

## 2019-10-06 MED ORDER — TRANEXAMIC ACID-NACL 1000-0.7 MG/100ML-% IV SOLN
1000.0000 mg | INTRAVENOUS | Status: AC
  Administered 2019-10-06: 16:00:00 1000 mg via INTRAVENOUS
  Filled 2019-10-06: qty 100

## 2019-10-06 MED ORDER — FENTANYL CITRATE (PF) 100 MCG/2ML IJ SOLN
50.0000 ug | INTRAMUSCULAR | Status: DC
  Administered 2019-10-06 (×2): 25 ug via INTRAVENOUS
  Administered 2019-10-06: 17:00:00 50 ug via INTRAVENOUS
  Administered 2019-10-06: 15:00:00 100 ug via INTRAVENOUS
  Administered 2019-10-06 (×3): 50 ug via INTRAVENOUS
  Filled 2019-10-06: qty 2

## 2019-10-06 MED ORDER — METHOCARBAMOL 500 MG IVPB - SIMPLE MED
INTRAVENOUS | Status: AC
  Filled 2019-10-06: qty 50

## 2019-10-06 MED ORDER — DEXAMETHASONE SODIUM PHOSPHATE 4 MG/ML IJ SOLN
INTRAMUSCULAR | Status: DC | PRN
  Administered 2019-10-06: 10 mg via INTRAVENOUS

## 2019-10-06 MED ORDER — SODIUM CHLORIDE (PF) 0.9 % IJ SOLN
INTRAMUSCULAR | Status: AC
  Filled 2019-10-06: qty 50

## 2019-10-06 MED ORDER — LIDOCAINE 2% (20 MG/ML) 5 ML SYRINGE
INTRAMUSCULAR | Status: AC
  Filled 2019-10-06: qty 5

## 2019-10-06 MED ORDER — BUPIVACAINE-EPINEPHRINE (PF) 0.5% -1:200000 IJ SOLN
INTRAMUSCULAR | Status: DC | PRN
  Administered 2019-10-06: 15 mL via PERINEURAL

## 2019-10-06 MED ORDER — ACETAMINOPHEN 500 MG PO TABS
1000.0000 mg | ORAL_TABLET | Freq: Once | ORAL | Status: AC
  Administered 2019-10-06: 14:00:00 1000 mg via ORAL
  Filled 2019-10-06: qty 2

## 2019-10-06 MED ORDER — DEXAMETHASONE SODIUM PHOSPHATE 10 MG/ML IJ SOLN: 10.0000 mg | Freq: Once | INTRAMUSCULAR | Status: DC

## 2019-10-06 MED ORDER — PROPOFOL 10 MG/ML IV BOLUS
INTRAVENOUS | Status: DC | PRN
  Administered 2019-10-06: 200 mg via INTRAVENOUS

## 2019-10-06 MED ORDER — 0.9 % SODIUM CHLORIDE (POUR BTL) OPTIME
TOPICAL | Status: DC | PRN
  Administered 2019-10-06: 1000 mL

## 2019-10-06 SURGICAL SUPPLY — 56 items
ADH SKN CLS APL DERMABOND .7 (GAUZE/BANDAGES/DRESSINGS) ×1
BAG ZIPLOCK 12X15 (MISCELLANEOUS) ×3 IMPLANT
BIT DRILL 2.9 CANN QC NONSTRL (BIT) ×3 IMPLANT
BLADE SAW SGTL 11.0X1.19X90.0M (BLADE) IMPLANT
BNDG COHESIVE 6X5 TAN STRL LF (GAUZE/BANDAGES/DRESSINGS) ×3 IMPLANT
BNDG ELASTIC 6X10 VLCR STRL LF (GAUZE/BANDAGES/DRESSINGS) ×3 IMPLANT
CLOTH BEACON ORANGE TIMEOUT ST (SAFETY) ×3 IMPLANT
COVER SURGICAL LIGHT HANDLE (MISCELLANEOUS) ×3 IMPLANT
COVER WAND RF STERILE (DRAPES) IMPLANT
CUFF TOURN SGL QUICK 34 (TOURNIQUET CUFF)
CUFF TOURN SGL QUICK 42 (TOURNIQUET CUFF) IMPLANT
CUFF TRNQT CYL 34X4.125X (TOURNIQUET CUFF) IMPLANT
DECANTER SPIKE VIAL GLASS SM (MISCELLANEOUS) ×3 IMPLANT
DERMABOND ADVANCED (GAUZE/BANDAGES/DRESSINGS) ×2
DERMABOND ADVANCED .7 DNX12 (GAUZE/BANDAGES/DRESSINGS) ×1 IMPLANT
DRAPE C-ARMOR (DRAPES) ×3 IMPLANT
DRAPE U-SHAPE 47X51 STRL (DRAPES) ×3 IMPLANT
DRSG AQUACEL AG ADV 3.5X10 (GAUZE/BANDAGES/DRESSINGS) ×3 IMPLANT
DURAPREP 26ML APPLICATOR (WOUND CARE) ×3 IMPLANT
ELECT REM PT RETURN 15FT ADLT (MISCELLANEOUS) ×3 IMPLANT
GLOVE BIOGEL M 7.0 STRL (GLOVE) IMPLANT
GLOVE BIOGEL PI IND STRL 7.5 (GLOVE) ×1 IMPLANT
GLOVE BIOGEL PI IND STRL 8.5 (GLOVE) ×1 IMPLANT
GLOVE BIOGEL PI INDICATOR 7.5 (GLOVE) ×2
GLOVE BIOGEL PI INDICATOR 8.5 (GLOVE) ×2
GLOVE ECLIPSE 8.0 STRL XLNG CF (GLOVE) IMPLANT
GLOVE ORTHO TXT STRL SZ7.5 (GLOVE) ×6 IMPLANT
GLOVE SURG ORTHO 8.0 STRL STRW (GLOVE) ×3 IMPLANT
GOWN STRL REUS W/TWL LRG LVL3 (GOWN DISPOSABLE) ×3 IMPLANT
GOWN STRL REUS W/TWL XL LVL3 (GOWN DISPOSABLE) ×6 IMPLANT
K-WIRE ACE 1.6X6 (WIRE) ×6
KIT TURNOVER KIT A (KITS) IMPLANT
KWIRE ACE 1.6X6 (WIRE) ×2 IMPLANT
MANIFOLD NEPTUNE II (INSTRUMENTS) ×3 IMPLANT
NS IRRIG 1000ML POUR BTL (IV SOLUTION) ×3 IMPLANT
PACK TOTAL KNEE CUSTOM (KITS) ×3 IMPLANT
PADDING CAST COTTON 6X4 STRL (CAST SUPPLIES) ×3 IMPLANT
PASSER SUT SWANSON 36MM LOOP (INSTRUMENTS) ×3 IMPLANT
PENCIL SMOKE EVACUATOR (MISCELLANEOUS) IMPLANT
PROTECTOR NERVE ULNAR (MISCELLANEOUS) ×3 IMPLANT
SCREW ACE CAN 4.0 40M (Screw) ×6 IMPLANT
SCREW ACE CAN 4.0 42M (Screw) ×3 IMPLANT
STAPLER VISISTAT (STAPLE) ×3 IMPLANT
SUT ETHIBOND NAB CT1 #1 30IN (SUTURE) ×6 IMPLANT
SUT FIBERWIRE #2 38 T-5 BLUE (SUTURE) ×3
SUT MNCRL AB 4-0 PS2 18 (SUTURE) ×3 IMPLANT
SUT VIC AB 0 CT1 27 (SUTURE) ×4
SUT VIC AB 0 CT1 27XBRD ANTBC (SUTURE) ×2 IMPLANT
SUT VIC AB 1 CT1 27 (SUTURE) ×6
SUT VIC AB 1 CT1 27XBRD ANTBC (SUTURE) ×2 IMPLANT
SUT VIC AB 2-0 CT1 27 (SUTURE) ×6
SUT VIC AB 2-0 CT1 TAPERPNT 27 (SUTURE) ×2 IMPLANT
SUTURE FIBERWR #2 38 T-5 BLUE (SUTURE) ×1 IMPLANT
WASHER FLAT ACE (Orthopedic Implant) ×4 IMPLANT
WASHER PLAIN FLAT ACE NS 3PK (Orthopedic Implant) ×2 IMPLANT
WATER STERILE IRR 1000ML POUR (IV SOLUTION) ×3 IMPLANT

## 2019-10-06 NOTE — Anesthesia Procedure Notes (Addendum)
Anesthesia Regional Block: Adductor canal block   Pre-Anesthetic Checklist: ,, timeout performed, Correct Patient, Correct Site, Correct Laterality, Correct Procedure, Correct Position, site marked, Risks and benefits discussed, pre-op evaluation,  At surgeon's request and post-op pain management  Laterality: Left  Prep: Maximum Sterile Barrier Precautions used, chloraprep       Needles:  Injection technique: Single-shot  Needle Type: Echogenic Stimulator Needle     Needle Length: 9cm  Needle Gauge: 22     Additional Needles:   Procedures:,,,, ultrasound used (permanent image in chart),,,,  Narrative:  Start time: 10/06/2019 2:04 PM End time: 10/06/2019 2:06 PM Injection made incrementally with aspirations every 5 mL.  Performed by: Personally  Anesthesiologist: Kaylyn Layer, MD  Additional Notes: Risks, benefits, and alternative discussed. Patient gave consent for procedure. Patient prepped and draped in sterile fashion. Sedation administered, patient remains easily responsive to voice. Relevant anatomy identified with ultrasound guidance. Local anesthetic given in 5cc increments with no signs or symptoms of intravascular injection. No pain or paraesthesias with injection. Patient monitored throughout procedure with signs of LAST or immediate complications. Tolerated well. Ultrasound image placed in chart.  Amalia Greenhouse, MD

## 2019-10-06 NOTE — Interval H&P Note (Signed)
History and Physical Interval Note:  10/06/2019 3:29 PM  Craig Barnes  has presented today for surgery, with the diagnosis of Left knee patella fracture.  The various methods of treatment have been discussed with the patient and family. After consideration of risks, benefits and other options for treatment, the patient has consented to  Procedure(s) with comments: OPEN REDUCTION INTERNAL (ORIF) FIXATION PATELLA (Left) - 90 mins as a surgical intervention.  The patient's history has been reviewed, patient examined, no change in status, stable for surgery.  I have reviewed the patient's chart and labs.  Questions were answered to the patient's satisfaction.     Shelda Pal

## 2019-10-06 NOTE — Transfer of Care (Signed)
Immediate Anesthesia Transfer of Care Note  Patient: Craig Barnes  Procedure(s) Performed: OPEN REDUCTION INTERNAL (ORIF) FIXATION PATELLA (Left Knee)  Patient Location: PACU  Anesthesia Type:GA combined with regional for post-op pain  Level of Consciousness: drowsy  Airway & Oxygen Therapy: Patient Spontanous Breathing and Patient connected to face mask  Post-op Assessment: Report given to RN and Post -op Vital signs reviewed and stable  Post vital signs: Reviewed and stable  Last Vitals:  Vitals Value Taken Time  BP 120/74 10/06/19 1707  Temp 36.9 C 10/06/19 1707  Pulse 77 10/06/19 1712  Resp 17 10/06/19 1712  SpO2 100 % 10/06/19 1712  Vitals shown include unvalidated device data.  Last Pain:  Vitals:   10/06/19 1707  TempSrc:   PainSc: (P) Asleep      Patients Stated Pain Goal: 2 (10/06/19 1401)  Complications: No apparent anesthesia complications

## 2019-10-06 NOTE — H&P (Addendum)
Craig Barnes is an 29 y.o. male.   Chief Complaint:  HPI: Left knee pain  Craig Barnes presented to the [facility] ED on 09/28/19 with complaint of left knee pain secondary to a fall on the ice on 09/27/19. He reports that he slipped and fell directly onto the left knee.He reported pain with ambulation and knee extension. He denies other injuries. X-ray was obtained, which revealed mildly displaced fracture through the inferior patella with large lipohemarthrosis. Dr. Charlann Boxer was consulted for orthopedic management.   History reviewed. No pertinent past medical history.  Past Surgical History:  Procedure Laterality Date  . APPENDECTOMY      Family History  Problem Relation Age of Onset  . Healthy Mother    Social History:  reports that he quit smoking about 3 years ago. He has never used smokeless tobacco. He reports current alcohol use. He reports current drug use. Drug: Marijuana.  Allergies:  Allergies  Allergen Reactions  . Amoxicillin Itching and Rash    Did it involve swelling of the face/tongue/throat, SOB, or low BP? No Did it involve sudden or severe rash/hives, skin peeling, or any reaction on the inside of your mouth or nose? No Did you need to seek medical attention at a hospital or doctor's office? Yes When did it last happen?~2013 If all above answers are "NO", may proceed with cephalosporin use.      Medications Prior to Admission  Medication Sig Dispense Refill  . HYDROcodone-acetaminophen (NORCO) 7.5-325 MG tablet Take 2 tablets by mouth every 6 (six) hours as needed for moderate pain.      Results for orders placed or performed during the hospital encounter of 10/06/19 (from the past 48 hour(s))  Type and screen Order type and screen if day of surgery is less than 15 days from draw of preadmission visit or order morning of surgery if day of surgery is greater than 6 days from preadmission visit.     Status: None   Collection Time: 10/06/19  1:10 PM  Result Value  Ref Range   ABO/RH(D) B NEG    Antibody Screen NEG    Sample Expiration      10/09/2019,2359 Performed at [facility], [address] [place], [place] [address]    No results found.  Review of Systems  Blood pressure 97/71, pulse 95, temperature 99.7 F (37.6 C), temperature source Oral, resp. rate 14, height 6\' 1"  (1.854 m), weight 68.1 kg, SpO2 100 %. Physical Exam  Constitutional: He appears well-developed and well-nourished.  HENT:  Head: Normocephalic and atraumatic.  Cardiovascular: Intact distal pulses.  Respiratory: Effort normal.  Musculoskeletal:   Left knee: Swelling, deformity and erythema present. Decreased range of motion.     Assessment/Plan Closed, displaced fracture of the left inferior patella  Plan: Craig Barnes has a displaced inferior pole patella fracture. Dr. Roger Kill saw him in the office, and discussed treatment options including surgical repair. They reviewed risks, benefits, and expectations, and Craig Barnes wishes to proceed. We will plan for ORIF left patella on 10/06/19. All questions welcomed and addressed.  - Patient was instructed on what medications to stop prior to surgery. - Follow-up visit in 2 weeks with Dr. 12/04/19 - Prescriptions will be provided in hospital at time of discharge   Charlann Boxer, [facility] Orthopedic Surgery [facility] [place] [phone]     [phone], PA-C 10/06/2019, 3:06 PM

## 2019-10-06 NOTE — Anesthesia Postprocedure Evaluation (Signed)
Anesthesia Post Note  Patient: Craig Barnes  Procedure(s) Performed: OPEN REDUCTION INTERNAL (ORIF) FIXATION PATELLA (Left Knee)     Patient location during evaluation: PACU Anesthesia Type: General Level of consciousness: awake and alert and oriented Pain management: pain level controlled Vital Signs Assessment: post-procedure vital signs reviewed and stable Respiratory status: spontaneous breathing, nonlabored ventilation and respiratory function stable Cardiovascular status: blood pressure returned to baseline Postop Assessment: no apparent nausea or vomiting Anesthetic complications: no    Last Vitals:  Vitals:   10/06/19 1434 10/06/19 1707  BP: 97/71 120/74  Pulse: 95 81  Resp: 14 16  Temp:  36.9 C  SpO2: 100% 100%    Last Pain:  Vitals:   10/06/19 1707  TempSrc:   PainSc: Asleep                 Kaylyn Layer

## 2019-10-06 NOTE — Anesthesia Procedure Notes (Signed)
Procedure Name: LMA Insertion Date/Time: 10/06/2019 3:28 PM Performed by: Vanessa [place], CRNA Pre-anesthesia Checklist: Emergency Drugs available, Patient identified, Suction available and Patient being monitored Patient Re-evaluated:Patient Re-evaluated prior to induction Oxygen Delivery Method: Circle system utilized Preoxygenation: Pre-oxygenation with 100% oxygen Induction Type: IV induction Ventilation: Mask ventilation without difficulty LMA: LMA inserted LMA Size: 4.0 Number of attempts: 1 Placement Confirmation: positive ETCO2 and breath sounds checked- equal and bilateral Tube secured with: Tape Dental Injury: Teeth and Oropharynx as per pre-operative assessment

## 2019-10-06 NOTE — Progress Notes (Signed)
Dr Charlann Boxer notified no one in-house can set TROM, [facility] rep enroure but 45 minutes away.  Pt anxious to leave.  Instructed to discharge pt in knee immobilizer and bring TROM to next office visit.

## 2019-10-06 NOTE — Anesthesia Preprocedure Evaluation (Addendum)
Anesthesia Evaluation  Patient identified by MRN, date of birth, ID band Patient awake    Reviewed: Allergy & Precautions, NPO status , Patient's Chart, lab work & pertinent test results  History of Anesthesia Complications Negative for: history of anesthetic complications  Airway Mallampati: II  TM Distance: >3 FB Neck ROM: Full    Dental no notable dental hx.    Pulmonary former smoker,    Pulmonary exam normal        Cardiovascular negative cardio ROS Normal cardiovascular exam     Neuro/Psych negative neurological ROS  negative psych ROS   GI/Hepatic negative GI ROS, (+)     substance abuse  marijuana use,   Endo/Other  negative endocrine ROS  Renal/GU negative Renal ROS  negative genitourinary   Musculoskeletal negative musculoskeletal ROS (+)   Abdominal   Peds  Hematology negative hematology ROS (+)   Anesthesia Other Findings Day of surgery medications reviewed with patient.  Reproductive/Obstetrics negative OB ROS                            Anesthesia Physical Anesthesia Plan  ASA: II  Anesthesia Plan: General   Post-op Pain Management: GA combined w/ Regional for post-op pain   Induction: Intravenous  PONV Risk Score and Plan: 3 and Treatment may vary due to age or medical condition, Ondansetron, Dexamethasone and Midazolam  Airway Management Planned: LMA  Additional Equipment: None  Intra-op Plan:   Post-operative Plan: Extubation in OR  Informed Consent: I have reviewed the patients History and Physical, chart, labs and discussed the procedure including the risks, benefits and alternatives for the proposed anesthesia with the patient or authorized representative who has indicated his/her understanding and acceptance.     Dental advisory given  Plan Discussed with: CRNA  Anesthesia Plan Comments:        Anesthesia Quick Evaluation

## 2019-10-06 NOTE — Op Note (Signed)
NAME: Craig Barnes, [facility] MEDICAL RECORD [id number] ACCOUNT [id number] DATE OF BIRTH:1991/01/27 FACILITY: [facility] LOCATION: [facility] PHYSICIAN:Rafan Sanders Rosalia Hammers, MD  OPERATIVE REPORT  DATE OF PROCEDURE:  10/06/2019  PREOPERATIVE DIAGNOSIS:  Inferior pole of the left patellar fracture, closed.  POSTOPERATIVE DIAGNOSIS:  Inferior pole of the left patellar fracture, closed.  PROCEDURE:  Open reduction internal fixation of left patella fracture utilizing a Biomet 4.0 mm cannulated screw with washers x2 and a #2 FiberWire suture passed as a cerclage technique.  SURGEON:  Durene Romans, MD  ASSISTANT:  Arneta Cliche, PA-C.  Note that Ms. Stinsen was present for the entirety of the case from preoperative positioning, perioperative management of the operative extremity, assistance with positioning of screws and primary wound closure and general facilitation of the case.  ANESTHESIA:  Regional block plus general anesthetic.  SPECIMENS:  None.  COMPLICATIONS:  None.  ESTIMATED BLOOD LOSS:  Minimal.  TOURNIQUET:  Up for 38 minutes at 250 mmHg.  INDICATIONS:  The patient is a 29 year old male who unfortunately slipped on ice about a week ago.  He presented to the emergency room a day later with increased pain in his knee with swelling.  Radiographs revealed an inferior pole patellar fracture.   He was seen and evaluated in the office.  The indications for surgery were discussed, including the displacement of the fracture segment.  We discussed operative repair from primary fixation of the bone as opposed to having to repair this as a tendon  repair.  We discussed the long-term implications of trauma to the patella resulting in early arthritic changes.  Standard risk for infection and DVT were reviewed.  Consent was obtained for management of the fracture.  PROCEDURE IN DETAIL:  The patient was brought to the operative theater.  Once adequate anesthesia, preoperative antibiotics, Ancef  administered,  he was positioned supine with a thigh tourniquet placed.  The left lower extremity was prepped and draped in  sterile fashion.  A timeout was performed, identifying the patient, the planned procedure and extremity.  Leg was exsanguinated, tourniquet elevated to 250 mmHg.  We made a midline incision creating soft tissue planes.  There was no defect in the  extensor mechanism or retinacular tissue.  I made a small 1 cm opening in the medial aspect of his retinaculum to evacuate the lipohemarthrosis.  This was evacuated fully and then irrigated with normal saline solution with a bulb syringe.  We now  attended to repair the fracture.  I used a large bone tenaculum at the inferior midportion of the patella to the proximal aspect.  I was able to get the fracture reduced under fluoroscopic imaging in both AP and lateral planes into what appeared to be an  anatomic position without significant displacement.  With the fracture reduced in this fashion, we then used guidewires for the 4.0 mm cannulated screws.  I then passed them using fluoroscopic imaging to confirm their orientation.  They were basically  passed in parallel within the midportion of the patella.  I then measured and selected the 42 and a 40 mm cannulated screw, drilled through the proximal cortex to allow for passage of the FiberWire suture.  I then kept the screws within the patellar bone  itself.  I did elect to use a washer at the end of the patella due to the fact of the nature of the inferior bone and the concern for any potential comminution and the head of the screws going within the inferior aspect of the  patella.  The screws were  passed to the point where they were not penetrating the outer cortex proximally.  Once this was done, we passed a #2 FiberWire suture in a circular fashion through the screws and tied them in the proximal medial aspect of the patella.  Final fluoroscopic  images were taken in AP and lateral plane,  confirming the reduction of the fracture and the orientation in both planes.  We irrigated the knee again and after the tourniquet was let down.  Minor hemostasis was required.  I did re-irrigate the knee  intraarticularly to remove any further hemarthrosis.  During the procedure, I made longitudinal incisions within the patellar tendon and quadriceps tendon to pass the sutures and these were reapproximated using #1 Vicryl.  The medial retinacular opening  that I used to evacuate the hemarthrosis was reapproximated using #1 Vicryl as well.  The remainder of the wound was closed in layers with 2-0 Vicryl and a running Monocryl stitch.  The knee was cleaned, dried and dressed sterilely using surgical glue  and Aquacel dressing.  The knee was then wrapped in a bulky dressing with cast padding and an Ace wrap and the knee then placed into a T-ROM knee brace locked in extension.  He will go to the recovery room and remain in the recovery room until stable to  go home.  He can be weightbearing as tolerated using crutches.  This was reviewed with he and his mother.  Findings were reviewed with his mom.  We will see him back in the office in 2 weeks.  He will be immobilized for at least a month, if not for 6  weeks to make certain we have bony healing before working on range of motion exercises.  VN/NUANCE  D:10/06/2019 T:10/06/2019 JOB:[id number]

## 2019-10-06 NOTE — Brief Op Note (Signed)
10/06/2019  4:47 PM  PATIENT:  Craig Barnes  29 y.o. male  PRE-OPERATIVE DIAGNOSIS:  Left knee inferior pole patella fracture  POST-OPERATIVE DIAGNOSIS:  Left knee inferior pole patella fracture  PROCEDURE:  Procedure(s) with comments: OPEN REDUCTION INTERNAL (ORIF) FIXATION PATELLA (Left) - 90 mins  SURGEON:  Surgeon(s) and Role:    Durene Romans, MD - Primary  PHYSICIAN ASSISTANT: Dennie Bible, PA-C  ANESTHESIA:   regional and general  EBL:  50 mL   BLOOD ADMINISTERED:none  DRAINS: none   LOCAL MEDICATIONS USED:  NONE  SPECIMEN:  No Specimen  DISPOSITION OF SPECIMEN:  N/A  COUNTS:  YES  TOURNIQUET:   Total Tourniquet Time Documented: Thigh (Left) - 38 minutes Total: Thigh (Left) - 38 minutes   DICTATION: .Other Dictation: Dictation Number [id number]  PLAN OF CARE: Discharge to home after PACU  PATIENT DISPOSITION:  PACU - hemodynamically stable.   Delay start of Pharmacological VTE agent (>24hrs) due to surgical blood loss or risk of bleeding: no

## 2019-10-06 NOTE — Progress Notes (Signed)
Assisted Dr. Howse with left, ultrasound guided, adductor canal block. Side rails up, monitors on throughout procedure. See vital signs in flow sheet. Tolerated Procedure well.  

## 2019-10-06 NOTE — Progress Notes (Signed)
Orthopedic Tech Progress Note Patient Details:  Craig Barnes Dec 19, 1990 [id number] Therapy called and put in order this morning to [facility] but forgot a little info on patient. [facility] called and needed me to go looking for that info. Patient ID: Craig Barnes, male   DOB: 1991/02/20, 29 y.o.   MRN: [id number]   [name] 10/06/2019, 2:54 PM

## 2019-10-07 ENCOUNTER — Encounter: Payer: Self-pay | Admitting: *Deleted

## 2019-10-07 LAB — ABO/RH: ABO/RH(D): B NEG

## 2019-10-07 NOTE — Addendum Note (Signed)
Addendum  created 10/07/19 1546 by Kaylyn Layer, MD   Clinical Note Signed, Intraprocedure Blocks edited

## 2019-10-11 ENCOUNTER — Ambulatory Visit (HOSPITAL_COMMUNITY)
Admission: EM | Admit: 2019-10-11 | Discharge: 2019-10-11 | Disposition: A | Discharge status: Home / Self Care | Payer: 59 | Attending: Family Medicine | Admitting: Family Medicine

## 2019-10-11 ENCOUNTER — Encounter (HOSPITAL_COMMUNITY): Payer: Self-pay

## 2019-10-11 ENCOUNTER — Other Ambulatory Visit: Payer: Self-pay

## 2019-10-11 DIAGNOSIS — K5903 Drug induced constipation: Secondary | ICD-10-CM | POA: Diagnosis not present

## 2019-10-11 DIAGNOSIS — T402X5A Adverse effect of other opioids, initial encounter: Secondary | ICD-10-CM | POA: Diagnosis not present

## 2019-10-11 MED ORDER — LACTULOSE 20 GM/30ML PO SOLN: 30.0000 mL | Freq: Two times a day (BID) | ORAL | 0 refills | Status: DC | PRN

## 2019-10-11 NOTE — Discharge Instructions (Signed)
As you are able, stopping the pain medication will help. Continue taking your stool softener and ensure you are drinking plenty of fluids.

## 2019-10-11 NOTE — ED Triage Notes (Signed)
Patient presents to Urgent Care with complaints of constipation since 2 weeks ago. Patient reports he broke his knee cap and so has been on pain medication that has resulted in the inability to have a BM since the break. Pt took laxatives last night but the best he could do was finally have the urge to have a BM; nothing came out.  Pt began having abdominal pain yesterday, is able to urinate without problem.

## 2019-10-13 NOTE — ED Provider Notes (Signed)
[facility]   [id number] 10/11/19 Arrival Time: 1025  ASSESSMENT & PLAN:  1. Constipation due to opioid therapy    Continue stool softener. Begin trial of: Meds ordered this encounter  Medications  . Lactulose 20 GM/30ML SOLN    Sig: Take 30 mLs (20 g total) by mouth 2 (two) times daily as needed (for constipation).    Dispense:  450 mL    Refill:  0    Follow-up Information    [facility].   Specialty: Urgent Care Why: If worsening or failing to improve as anticipated. Contact information: [address] [place] [address] [phone]         Benign abdominal exam. No indication for urgent abdominal imaging at this time. No signs of SBO.  Reviewed expectations re: course of current medical issues. Questions answered. Outlined signs and symptoms indicating need for more acute intervention. Patient verbalized understanding. After Visit Summary given.   SUBJECTIVE: History from: patient.  Craig Barnes is a 29 y.o. male who presents with complaint of inability to have a normal bowel movement over the past two weeks. Reports surgery for fractured patella on 10/07/19; has been taking hydrocodone to control pain with resulting constipation. Did pass "a few hard pellets" yesterday but with much straining. Afebrile. Has tried to cut down frequency of hydrocodone. Stool softener without much relief. No n/v reported. Ambulatory without difficulty.  Past Surgical History:  Procedure Laterality Date  . APPENDECTOMY    . ORIF PATELLA Left 10/06/2019   Procedure: OPEN REDUCTION INTERNAL (ORIF) FIXATION PATELLA;  Surgeon: Durene Romans, MD;  Location: [facility];  Service: Orthopedics;  Laterality: Left;  90 mins    ROS: As per HPI.  OBJECTIVE:  Vitals:   10/11/19 1045  BP: (!) 114/92  Pulse: 87  Resp: 17  Temp: 99 F (37.2 C)  TempSrc: Oral  SpO2: 99%    General appearance: alert; no distress Lungs: speaks  full sentences without difficulty; unlabored Heart: regular rate and rhythm Abdomen: soft; non-distended; no significant abdominal tenderness; no guarding or rebound tenderness Extremities: no edema; wearing brace over left knee Skin: warm; dry Psychological: alert and cooperative; normal mood and affect   Allergies  Allergen Reactions  . Amoxicillin Itching and Rash    Did it involve swelling of the face/tongue/throat, SOB, or low BP? No Did it involve sudden or severe rash/hives, skin peeling, or any reaction on the inside of your mouth or nose? No Did you need to seek medical attention at a hospital or doctor's office? Yes When did it last happen?~2013 If all above answers are "NO", may proceed with cephalosporin use.                                                 History reviewed. No pertinent past medical history.   Social History   Socioeconomic History  . Marital status: Single    Spouse name: Not on file  . Number of children: Not on file  . Years of education: Not on file  . Highest education level: Not on file  Occupational History  . Not on file  Tobacco Use  . Smoking status: Former Smoker    Quit date: 2018    Years since quitting: 3.1  . Smokeless tobacco: Never Used  Substance and Sexual  Activity  . Alcohol use: Yes    Comment: 2-3x/wk  . Drug use: Yes    Types: Marijuana  . Sexual activity: Not Currently  Other Topics Concern  . Not on file  Social History Narrative  . Not on file   Social Determinants of Health   Financial Resource Strain:   . Difficulty of Paying Living Expenses: Not on file  Food Insecurity:   . Worried About Charity fundraiser in the Last Year: Not on file  . Ran Out of Food in the Last Year: Not on file  Transportation Needs:   . Lack of Transportation (Medical): Not on file  . Lack of Transportation (Non-Medical): Not on file  Physical Activity:   . Days of Exercise per Week: Not on file  . Minutes of Exercise per  Session: Not on file  Stress:   . Feeling of Stress : Not on file  Social Connections:   . Frequency of Communication with Friends and Family: Not on file  . Frequency of Social Gatherings with Friends and Family: Not on file  . Attends Religious Services: Not on file  . Active Member of Clubs or Organizations: Not on file  . Attends Archivist Meetings: Not on file  . Marital Status: Not on file  Intimate Partner Violence:   . Fear of Current or Ex-Partner: Not on file  . Emotionally Abused: Not on file  . Physically Abused: Not on file  . Sexually Abused: Not on file   Family History  Problem Relation Age of Onset  . Healthy Mother      Vanessa Kick, MD 10/13/19 [phone]

## 2021-05-16 ENCOUNTER — Ambulatory Visit (HOSPITAL_COMMUNITY)
Admission: EM | Admit: 2021-05-16 | Discharge: 2021-05-16 | Disposition: A | Discharge status: Home / Self Care | Payer: 59 | Attending: Student | Admitting: Student

## 2021-05-16 ENCOUNTER — Encounter (HOSPITAL_COMMUNITY): Payer: Self-pay | Admitting: Emergency Medicine

## 2021-05-16 ENCOUNTER — Other Ambulatory Visit: Payer: Self-pay

## 2021-05-16 DIAGNOSIS — M5440 Lumbago with sciatica, unspecified side: Secondary | ICD-10-CM

## 2021-05-16 DIAGNOSIS — M5442 Lumbago with sciatica, left side: Secondary | ICD-10-CM | POA: Diagnosis not present

## 2021-05-16 MED ORDER — METHYLPREDNISOLONE SODIUM SUCC 125 MG IJ SOLR
60.0000 mg | Freq: Once | INTRAMUSCULAR | Status: AC
  Administered 2021-05-16: 60 mg via INTRAMUSCULAR

## 2021-05-16 MED ORDER — METHYLPREDNISOLONE SODIUM SUCC 125 MG IJ SOLR
INTRAMUSCULAR | Status: AC
  Filled 2021-05-16: qty 2

## 2021-05-16 MED ORDER — TIZANIDINE HCL 2 MG PO TABS: 2.0000 mg | ORAL_TABLET | Freq: Three times a day (TID) | ORAL | 0 refills | Status: DC | PRN

## 2021-05-16 MED ORDER — KETOROLAC TROMETHAMINE 10 MG PO TABS: 10.0000 mg | ORAL_TABLET | Freq: Four times a day (QID) | ORAL | 0 refills | Status: DC | PRN

## 2021-05-16 NOTE — Discharge Instructions (Addendum)
-  Start the muscle relaxer-Zanaflex (tizanidine), up to 3 times daily for muscle spasms and pain.  This can make you drowsy, so take at bedtime or when you do not need to drive or operate machinery. -Toradol (Ketorolac), one pill up to every 6 hours for pain. Make sure to take this with food. Avoid other NSAIDs like ibuprofen, alleve, naproxen while taking this medication.  -You can also take Tylenol 1000mg  up to 3x daily -Heating pad, rest

## 2021-05-16 NOTE — ED Provider Notes (Signed)
[facility]    CSN: [id number] Arrival date & time: 05/16/21  8182      History   Chief Complaint Chief Complaint  Patient presents with   Back Pain    HPI Craig Barnes is a 30 y.o. male presenting with left-sided back pain x2 days. Medical history noncontributory.  Denies trauma but does endorse overuse, active job at [employer].  Endorses 2 days of left-sided lower back pain with some radiation of pain down the back of his leg.  Symptoms are worse with standing and moving, and relieved with rest.  Has not tried medications for the symptoms. Distant history of similar about 10 years ago. Denies denies numbness in arms/legs, denies weakness in arms/legs, denies saddle anesthesia, denies bowel/bladder incontinence, denies urinary retention, denies constipation.   HPI  History reviewed. No pertinent past medical history.  There are no problems to display for this patient.   Past Surgical History:  Procedure Laterality Date   APPENDECTOMY     ORIF PATELLA Left 10/06/2019   Procedure: OPEN REDUCTION INTERNAL (ORIF) FIXATION PATELLA;  Surgeon: Durene Romans, MD;  Location: [facility];  Service: Orthopedics;  Laterality: Left;  90 mins       Home Medications    Prior to Admission medications   Medication Sig Start Date End Date Taking? Authorizing Provider  HYDROcodone-acetaminophen (NORCO) 7.5-325 MG tablet Take 1 tablet by mouth every 6 (six) hours as needed for moderate pain.    [provider]  ketorolac (TORADOL) 10 MG tablet Take 1 tablet (10 mg total) by mouth every 6 (six) hours as needed. 05/16/21   Rhys Martini, PA-C  Lactulose 20 GM/30ML SOLN Take 30 mLs (20 g total) by mouth 2 (two) times daily as needed (for constipation). 10/11/19   Mardella Layman, MD  tiZANidine (ZANAFLEX) 2 MG tablet Take 1 tablet (2 mg total) by mouth every 8 (eight) hours as needed for muscle spasms. 05/16/21   Rhys Martini, PA-C  fluticasone (FLONASE) 50 MCG/ACT nasal  spray Place 2 sprays into both nostrils daily. 03/09/19 09/30/19  Belinda Fisher, PA-C    Family History Family History  Problem Relation Age of Onset   Healthy Mother     Social History Social History   Tobacco Use   Smoking status: Former    Types: Cigarettes    Quit date: 2018    Years since quitting: 4.7   Smokeless tobacco: Never  Vaping Use   Vaping Use: Every day   Substances: Nicotine, Flavoring  Substance Use Topics   Alcohol use: Yes    Comment: 2-3x/wk   Drug use: Yes    Types: Marijuana     Allergies   Amoxicillin   Review of Systems Review of Systems  Musculoskeletal:  Positive for back pain.  All other systems reviewed and are negative.   Physical Exam Triage Vital Signs ED Triage Vitals  Enc Vitals Group     BP 05/16/21 1130 112/75     Pulse Rate 05/16/21 1130 63     Resp 05/16/21 1130 16     Temp 05/16/21 1130 98.5 F (36.9 C)     Temp Source 05/16/21 1130 Oral     SpO2 05/16/21 1130 100 %     Weight --      Height --      Head Circumference --      Peak Flow --      Pain Score 05/16/21 1129 6  Pain Loc --      Pain Edu? --      Excl. in GC? --    No data found.  Updated Vital Signs BP 112/75 (BP Location: Right Arm)   Pulse 63   Temp 98.5 F (36.9 C) (Oral)   Resp 16   SpO2 100%   Visual Acuity Right Eye Distance:   Left Eye Distance:   Bilateral Distance:    Right Eye Near:   Left Eye Near:    Bilateral Near:     Physical Exam Vitals reviewed.  Constitutional:      General: He is not in acute distress.    Appearance: Normal appearance. He is not ill-appearing.  HENT:     Head: Normocephalic and atraumatic.  Cardiovascular:     Rate and Rhythm: Normal rate and regular rhythm.     Heart sounds: Normal heart sounds.  Pulmonary:     Effort: Pulmonary effort is normal.     Breath sounds: Normal breath sounds and air entry.  Abdominal:     Tenderness: There is no abdominal tenderness. There is no right CVA tenderness,  left CVA tenderness, guarding or rebound.  Musculoskeletal:     Cervical back: Normal range of motion. No swelling, deformity, signs of trauma, rigidity, spasms, tenderness, bony tenderness or crepitus. No pain with movement.     Thoracic back: No swelling, deformity, signs of trauma, spasms, tenderness or bony tenderness. Normal range of motion. No scoliosis.     Lumbar back: No swelling, deformity, signs of trauma, spasms, tenderness or bony tenderness. Normal range of motion. Negative right straight leg raise test and negative left straight leg raise test. No scoliosis.     Comments: left-sided lumbar paraspinous muscle tenderness to deep palpation, pain elicited with flexion and extension lumbar spine.  Also with positive left straight leg raise.  No cervical or thoracic paraspinous muscle tenderness. No midline spinous tenderness, deformity, stepoff.  Strength and sensation intact upper and lower extremities, gait intact but with pain.  No saddle anesthesia.  Absolutely no other injury, deformity, tenderness, ecchymosis, abrasion.  Neurological:     General: No focal deficit present.     Mental Status: He is alert.     Cranial Nerves: No cranial nerve deficit.  Psychiatric:        Mood and Affect: Mood normal.        Behavior: Behavior normal.        Thought Content: Thought content normal.        Judgment: Judgment normal.     UC Treatments / Results  Labs (all labs ordered are listed, but only abnormal results are displayed) Labs Reviewed - No data to display  EKG   Radiology No results found.  Procedures Procedures (including critical care time)  Medications Ordered in UC Medications  methylPREDNISolone sodium succinate (SOLU-MEDROL) 125 mg/2 mL injection 60 mg (60 mg Intramuscular Given 05/16/21 1145)    Initial Impression / Assessment and Plan / UC Course  I have reviewed the triage vital signs and the nursing notes.  Pertinent labs & imaging results that were  available during my care of the patient were reviewed by me and considered in my medical decision making (see chart for details).     This patient is a very pleasant 30 y.o. year old male presenting with lumbar strain and sciatica. No red flag symptoms. IM solumedrol administered. Toradol, zanaflex. Heating pad. ED return precautions discussed. Patient verbalizes understanding and agreement.   Marland Kitchen  Final Clinical Impressions(s) / UC Diagnoses   Final diagnoses:  Back pain of lumbar region with sciatica     Discharge Instructions      -Start the muscle relaxer-Zanaflex (tizanidine), up to 3 times daily for muscle spasms and pain.  This can make you drowsy, so take at bedtime or when you do not need to drive or operate machinery. -Toradol (Ketorolac), one pill up to every 6 hours for pain. Make sure to take this with food. Avoid other NSAIDs like ibuprofen, alleve, naproxen while taking this medication.  -You can also take Tylenol 1000mg  up to 3x daily -Heating pad, rest      ED Prescriptions     Medication Sig Dispense Auth. Provider   tiZANidine (ZANAFLEX) 2 MG tablet  (Status: Discontinued) Take 1 tablet (2 mg total) by mouth every 8 (eight) hours as needed for muscle spasms. 21 tablet , PA-C   ketorolac (TORADOL) 10 MG tablet  (Status: Discontinued) Take 1 tablet (10 mg total) by mouth every 6 (six) hours as needed. 20 tablet Rhys Martini, PA-C   ketorolac (TORADOL) 10 MG tablet Take 1 tablet (10 mg total) by mouth every 6 (six) hours as needed. 20 tablet Rhys Martini, PA-C   tiZANidine (ZANAFLEX) 2 MG tablet Take 1 tablet (2 mg total) by mouth every 8 (eight) hours as needed for muscle spasms. 21 tablet Rhys Martini, PA-C      PDMP not reviewed this encounter.   Rhys Martini, PA-C 05/16/21 1212

## 2021-05-16 NOTE — ED Triage Notes (Signed)
Pt reports woke up Saturday morning with left lower back pains. Denies falls injury, lifting. Denies bowel or urinary problems. Certain positions help relieve pain.

## 2021-11-24 IMAGING — RF DG C-ARM 1-60 MIN-NO REPORT
1 series · 2 of 2 positions shown · non-contrast
Comparison: 09/28/2019

CLINICAL DATA: Patellar fracture

EXAM:
LEFT KNEE 3 VIEWS; DG C-ARM 1-60 MIN-NO REPORT

[Series 1: unknown protocol · 0.14mm/px · 2 of 2 slices shown]
[im 1/2]
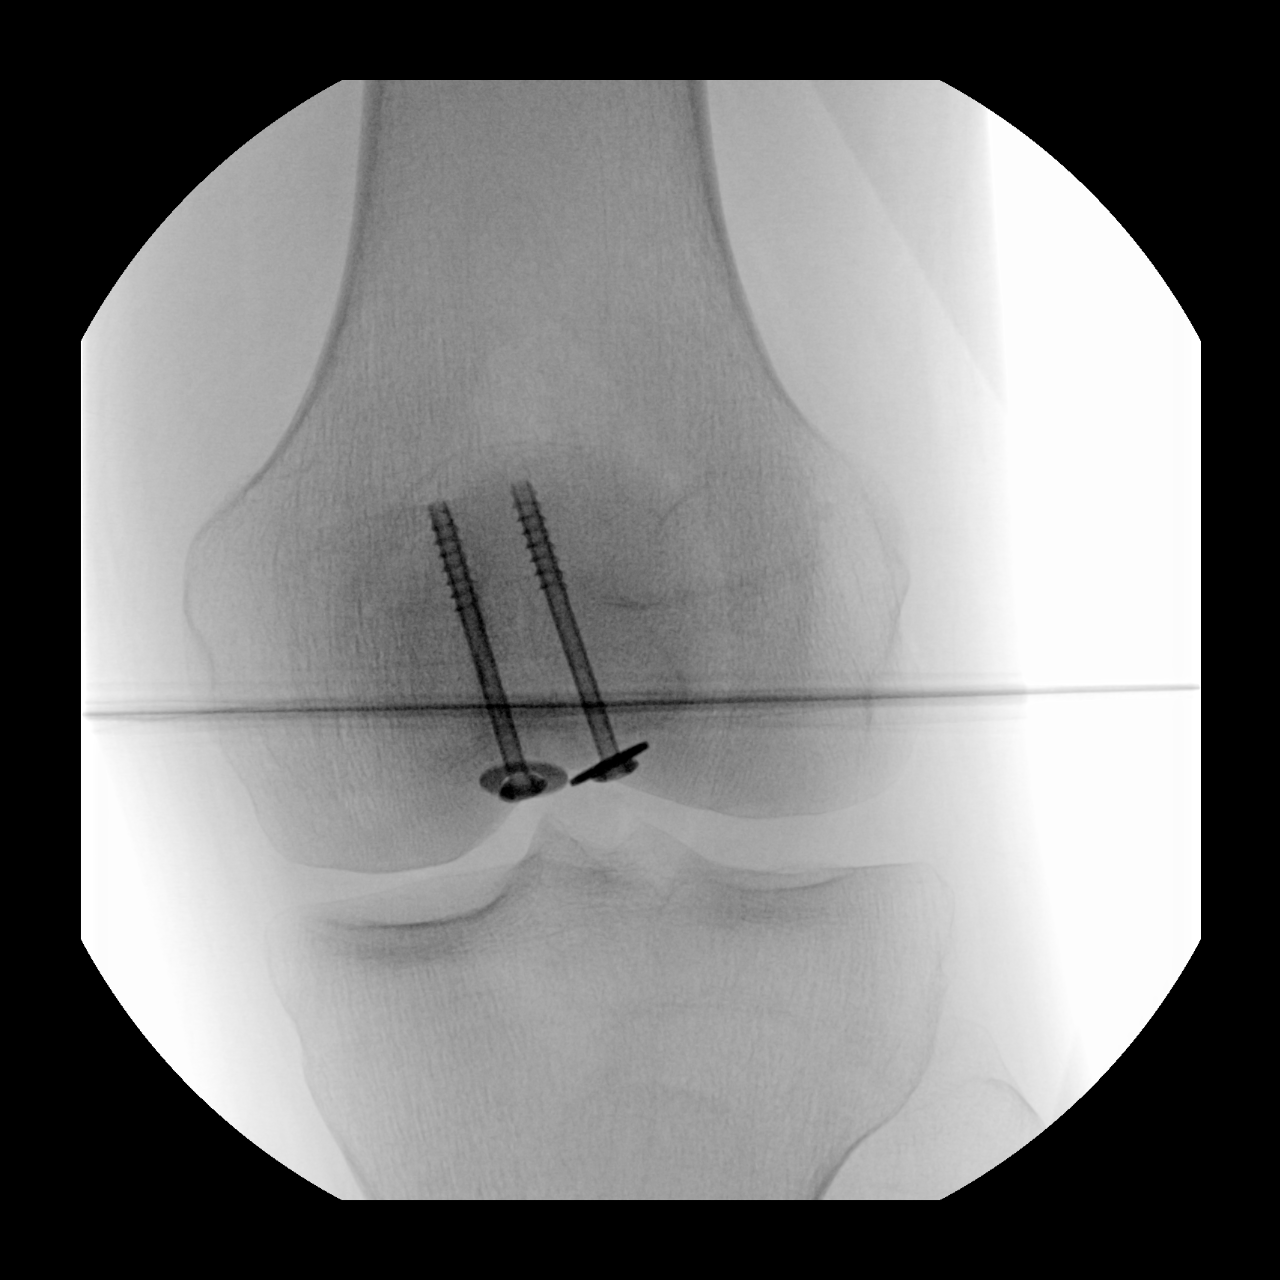
[im 2/2]
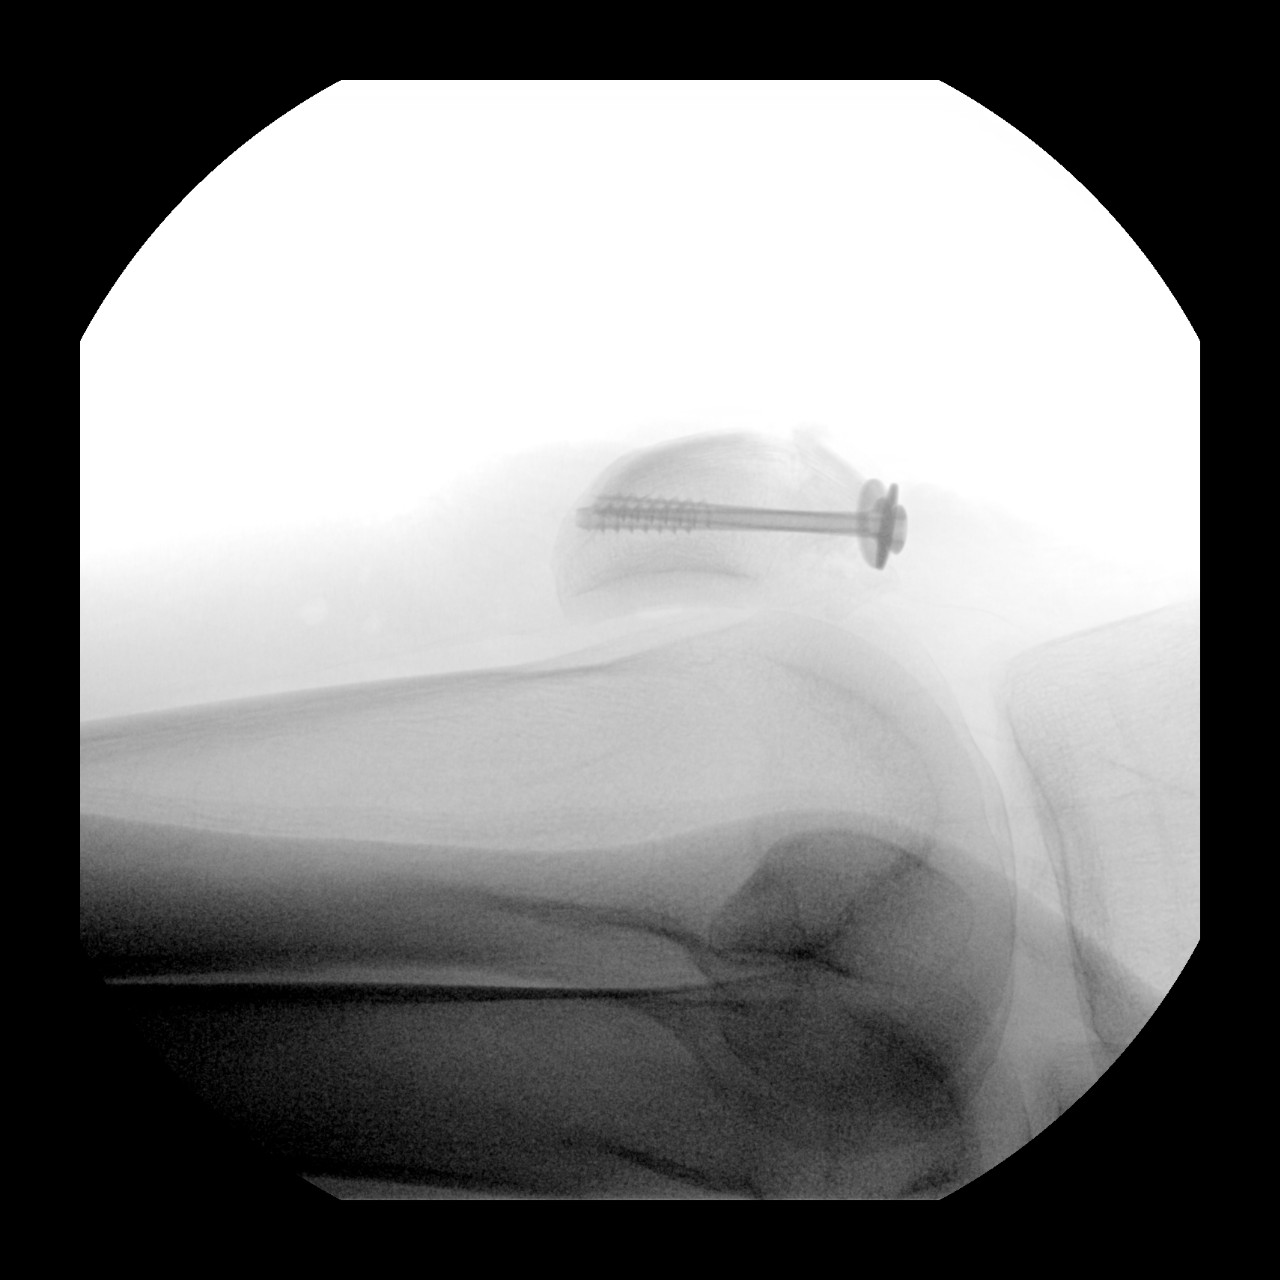

[2 of 2 positions shown; findings below may reference images not displayed]

FINDINGS: Spot fluoroscopic intraoperative views demonstrate 2 screws reducing
the inferior patellar fracture with improved alignment. Fracture
lines remain visible. No complicating feature.
IMPRESSION: Status post ORIF of the inferior left patellar fracture with
anatomic alignment. No complicating feature.

## 2021-11-24 IMAGING — RF DG KNEE AP/LAT W/ SUNRISE*L*
1 series · 2 of 2 positions shown · non-contrast
Comparison: 09/28/2019

CLINICAL DATA: Patellar fracture

EXAM:
LEFT KNEE 3 VIEWS; DG C-ARM 1-60 MIN-NO REPORT

[Series 1: unknown protocol · 0.14mm/px · 2 of 2 slices shown]
[im 1/2]
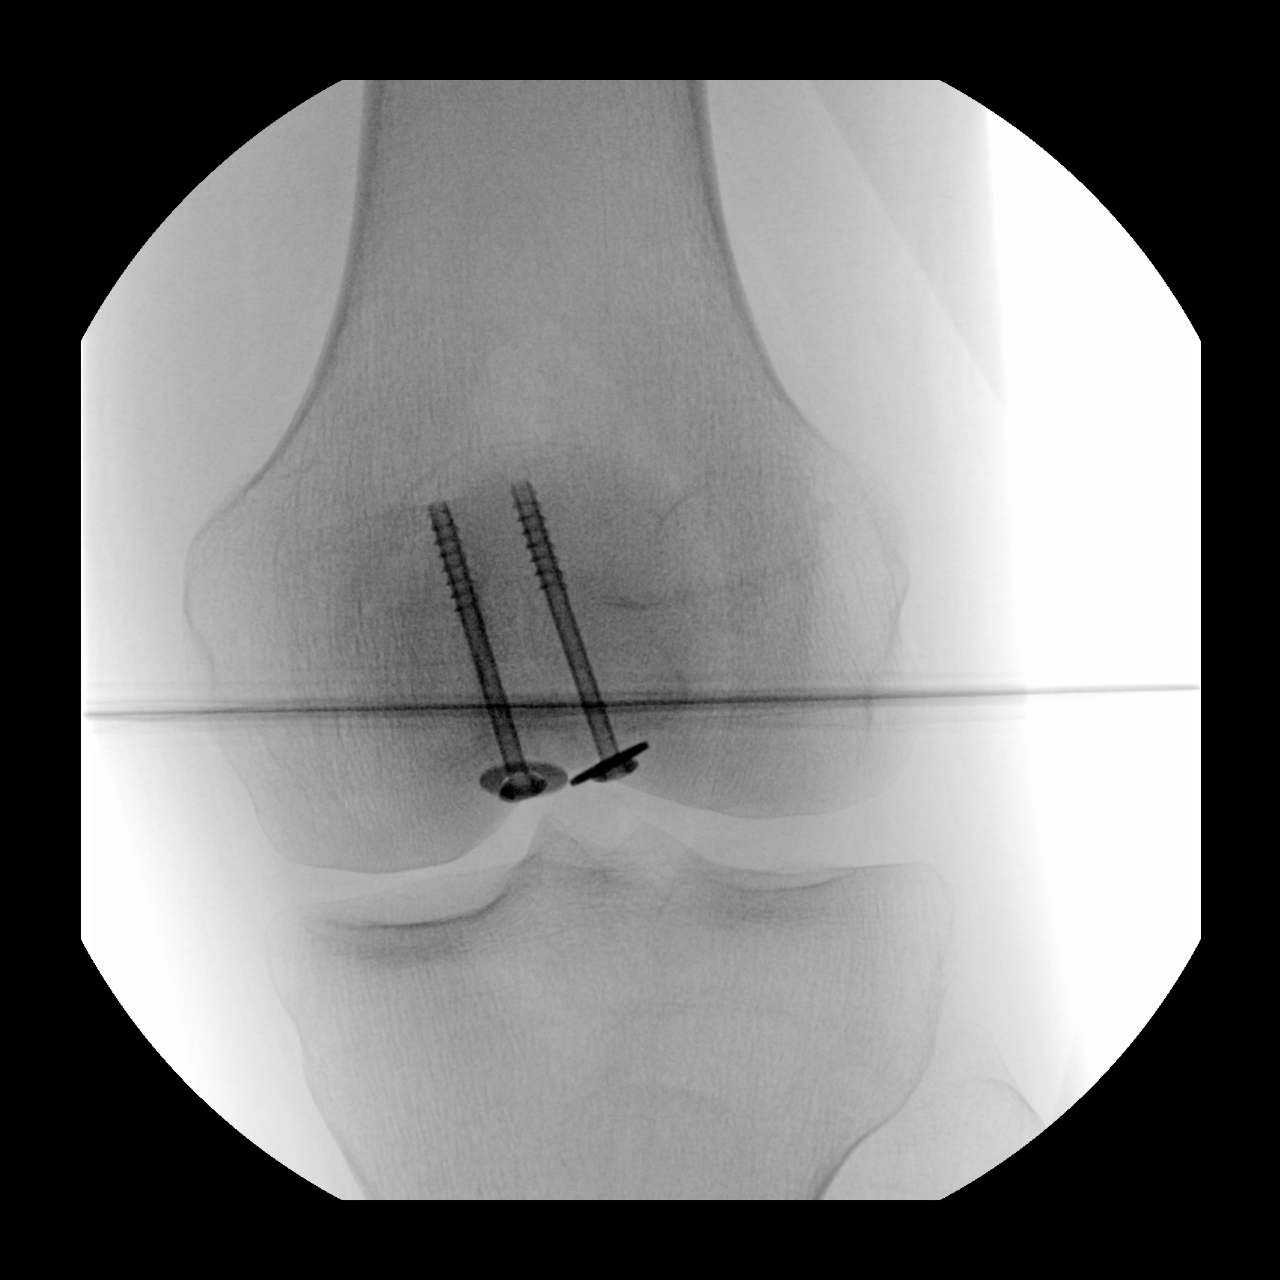
[im 2/2]
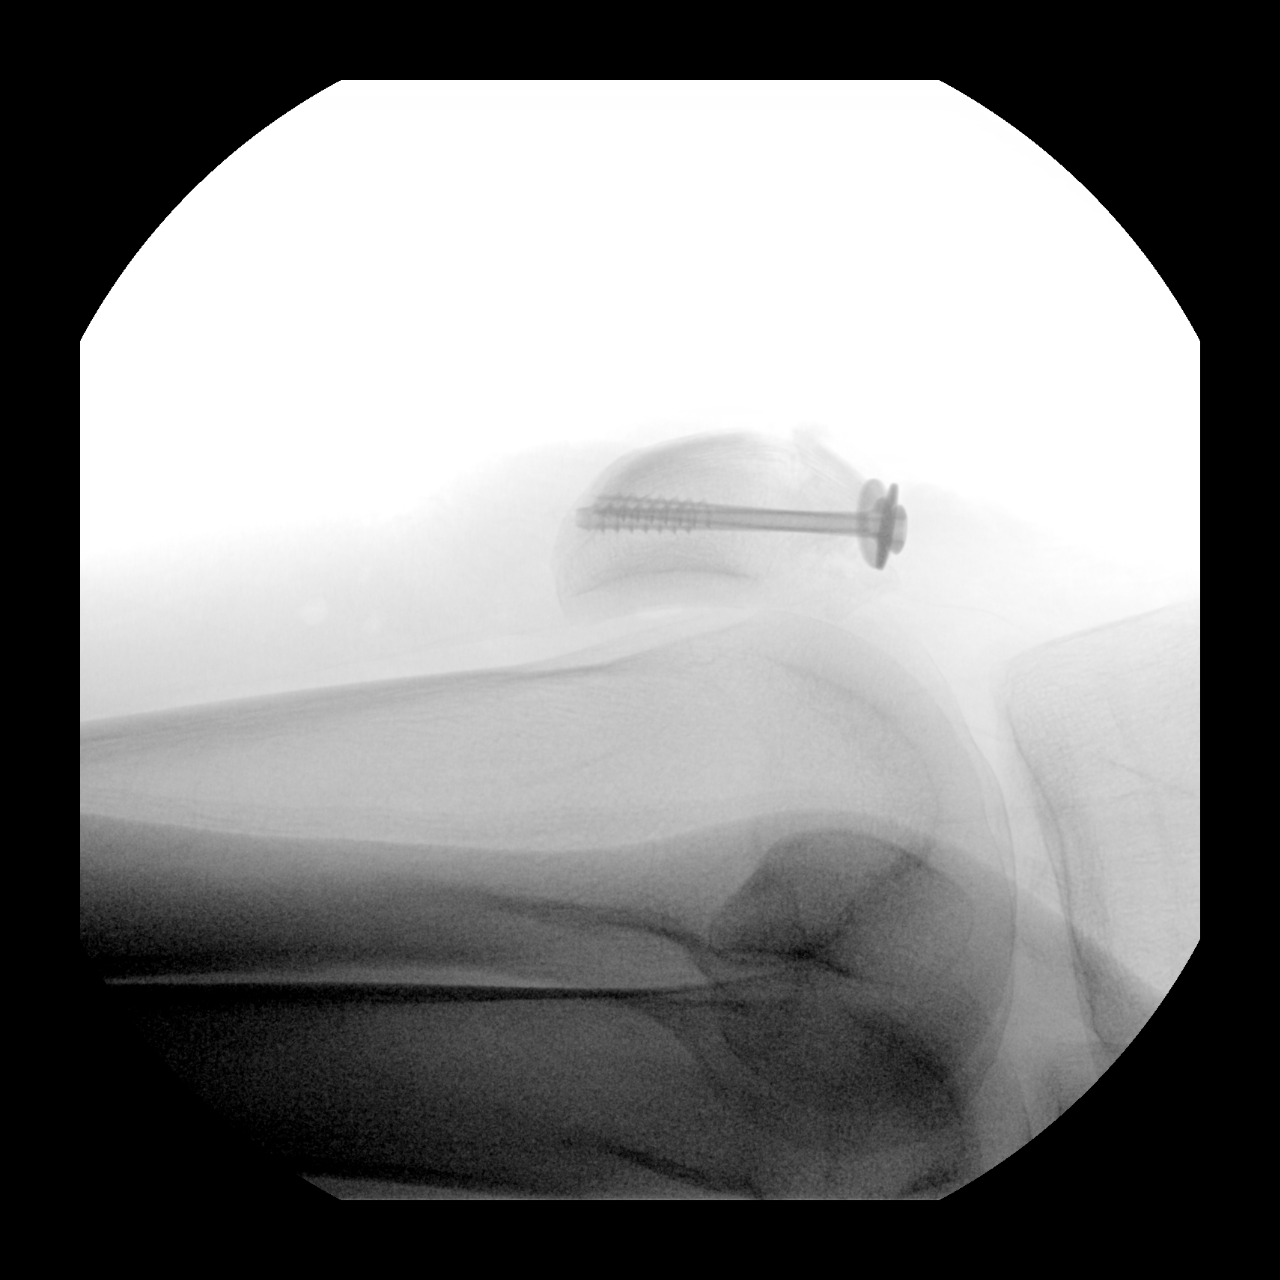

[2 of 2 positions shown; findings below may reference images not displayed]

FINDINGS: Spot fluoroscopic intraoperative views demonstrate 2 screws reducing
the inferior patellar fracture with improved alignment. Fracture
lines remain visible. No complicating feature.
IMPRESSION: Status post ORIF of the inferior left patellar fracture with
anatomic alignment. No complicating feature.

## 2022-01-20 ENCOUNTER — Ambulatory Visit (HOSPITAL_COMMUNITY)
Admission: RE | Admit: 2022-01-20 | Discharge: 2022-01-20 | Disposition: A | Discharge status: Home / Self Care | Payer: Self-pay | Source: Ambulatory Visit | Attending: Physician Assistant | Admitting: Physician Assistant

## 2022-01-20 ENCOUNTER — Encounter (HOSPITAL_COMMUNITY): Payer: Self-pay

## 2022-01-20 VITALS — BP 122/78 | HR 60 | Temp 98.0°F | Resp 18

## 2022-01-20 DIAGNOSIS — Z113 Encounter for screening for infections with a predominantly sexual mode of transmission: Secondary | ICD-10-CM | POA: Insufficient documentation

## 2022-01-20 DIAGNOSIS — R300 Dysuria: Secondary | ICD-10-CM | POA: Insufficient documentation

## 2022-01-20 DIAGNOSIS — N4889 Other specified disorders of penis: Secondary | ICD-10-CM | POA: Insufficient documentation

## 2022-01-20 LAB — HIV ANTIBODY (ROUTINE TESTING W REFLEX): HIV Screen 4th Generation wRfx: NONREACTIVE

## 2022-01-20 NOTE — ED Triage Notes (Signed)
Pt presents with concerns for std. Reports pain with urination and rash on the tip of his penis.

## 2022-01-20 NOTE — ED Provider Notes (Signed)
[facility]    CSN: [id number] Arrival date & time: 01/20/22  1256      History   Chief Complaint Chief Complaint  Patient presents with   SEXUALLY TRANSMITTED DISEASE    Entered by patient    HPI RANDOM DOBROWSKI is a 31 y.o. male.   31 year old male presents with dysuria and penile irritation.  Patient relates he would like to be tested for STIs.  Patient indicates over the past month he has had 2 separate sexual partners, unprotected, patient relates over the past several days he has been having some intermittent mild dysuria, and small amount of clear penile discharge intermittently.  Patient relates he also has been having some irritation or redness at the very tip of the penis which just started a couple days ago.  Patient relates he has not had any hematuria, no fever or chills.    History reviewed. No pertinent past medical history.  There are no problems to display for this patient.   Past Surgical History:  Procedure Laterality Date   APPENDECTOMY     ORIF PATELLA Left 10/06/2019   Procedure: OPEN REDUCTION INTERNAL (ORIF) FIXATION PATELLA;  Surgeon: Durene Romans, MD;  Location: [facility];  Service: Orthopedics;  Laterality: Left;  90 mins       Home Medications    Prior to Admission medications   Medication Sig Start Date End Date Taking? Authorizing Provider  HYDROcodone-acetaminophen (NORCO) 7.5-325 MG tablet Take 1 tablet by mouth every 6 (six) hours as needed for moderate pain.    [provider]  ketorolac (TORADOL) 10 MG tablet Take 1 tablet (10 mg total) by mouth every 6 (six) hours as needed. 05/16/21   Rhys Martini, PA-C  Lactulose 20 GM/30ML SOLN Take 30 mLs (20 g total) by mouth 2 (two) times daily as needed (for constipation). 10/11/19   Mardella Layman, MD  tiZANidine (ZANAFLEX) 2 MG tablet Take 1 tablet (2 mg total) by mouth every 8 (eight) hours as needed for muscle spasms. 05/16/21   Rhys Martini, PA-C  fluticasone (FLONASE) 50  MCG/ACT nasal spray Place 2 sprays into both nostrils daily. 03/09/19 09/30/19  Belinda Fisher, PA-C    Family History Family History  Problem Relation Age of Onset   Healthy Mother     Social History Social History   Tobacco Use   Smoking status: Former    Types: Cigarettes    Quit date: 2018    Years since quitting: 5.4   Smokeless tobacco: Never  Vaping Use   Vaping Use: Every day   Substances: Nicotine, Flavoring  Substance Use Topics   Alcohol use: Yes    Comment: 2-3x/wk   Drug use: Yes    Types: Marijuana     Allergies   Amoxicillin   Review of Systems Review of Systems  Genitourinary:  Positive for penile discharge and penile pain (mild redness at meatus area).    Physical Exam Triage Vital Signs ED Triage Vitals  Enc Vitals Group     BP      Pulse      Resp      Temp      Temp src      SpO2      Weight      Height      Head Circumference      Peak Flow      Pain Score      Pain Loc      Pain  Edu?      Excl. in GC?    No data found.  Updated Vital Signs BP 122/78   Pulse 60   Temp 98 F (36.7 C)   Resp 18   SpO2 98%   Visual Acuity Right Eye Distance:   Left Eye Distance:   Bilateral Distance:    Right Eye Near:   Left Eye Near:    Bilateral Near:     Physical Exam Constitutional:      Appearance: Normal appearance.  Genitourinary:    Comments: Penis: There is a very slight amount of redness at the superior aspect of the meatal opening, no swelling present.  The penile shaft is without ulcerations, the scrotal sac is without swelling or ulcerations. Neurological:     Mental Status: He is alert.     UC Treatments / Results  Labs (all labs ordered are listed, but only abnormal results are displayed) Labs Reviewed  RPR  HIV ANTIBODY (ROUTINE TESTING W REFLEX)  CYTOLOGY, (ORAL, ANAL, URETHRAL) ANCILLARY ONLY    EKG   Radiology No results found.  Procedures Procedures (including critical care time)  Medications Ordered  in UC Medications - No data to display  Initial Impression / Assessment and Plan / UC Course  I have reviewed the triage vital signs and the nursing notes.  Pertinent labs & imaging results that were available during my care of the patient were reviewed by me and considered in my medical decision making (see chart for details).    Plan: 1.  HIV, RPR, GC/chlamydia and trichomonas testing are pending. 2.  Patient advised to follow-up with PCP or to return to urgent care if symptoms fail to improve. Final Clinical Impressions(s) / UC Diagnoses   Final diagnoses:  Screening for STD (sexually transmitted disease)  Dysuria  Penile irritation     Discharge Instructions      Advised to use symptomatic treatment. Advised to follow-up with PCP or to return to urgent care if symptoms fail to resolve.     ED Prescriptions   None    PDMP not reviewed this encounter.   Ellsworth Lennox, PA-C 01/20/22 1326

## 2022-01-20 NOTE — Discharge Instructions (Addendum)
Advised to use symptomatic treatment. Advised to follow-up with PCP or to return to urgent care if symptoms fail to resolve.

## 2022-01-21 LAB — RPR: RPR Ser Ql: NONREACTIVE

## 2022-01-24 LAB — CYTOLOGY, (ORAL, ANAL, URETHRAL) ANCILLARY ONLY
Chlamydia: NEGATIVE
Comment: NEGATIVE
Comment: NEGATIVE
Comment: NORMAL
Neisseria Gonorrhea: NEGATIVE
Trichomonas: NEGATIVE

## 2023-03-18 ENCOUNTER — Ambulatory Visit (HOSPITAL_COMMUNITY)
Admission: EM | Admit: 2023-03-18 | Discharge: 2023-03-18 | Disposition: A | Discharge status: Home / Self Care | Payer: Self-pay

## 2024-05-07 ENCOUNTER — Ambulatory Visit (HOSPITAL_COMMUNITY): Payer: Self-pay | Admitting: Physician Assistant

## 2024-05-07 ENCOUNTER — Encounter: Payer: Self-pay | Admitting: Gastroenterology

## 2024-05-07 ENCOUNTER — Telehealth (HOSPITAL_COMMUNITY): Payer: Self-pay | Admitting: Physician Assistant

## 2024-05-07 ENCOUNTER — Ambulatory Visit (HOSPITAL_COMMUNITY)
Admission: EM | Admit: 2024-05-07 | Discharge: 2024-05-07 | Disposition: A | Discharge status: Home / Self Care | Payer: Self-pay | Attending: Physician Assistant | Admitting: Physician Assistant

## 2024-05-07 ENCOUNTER — Encounter (HOSPITAL_COMMUNITY): Payer: Self-pay | Admitting: Emergency Medicine

## 2024-05-07 DIAGNOSIS — R1084 Generalized abdominal pain: Secondary | ICD-10-CM | POA: Insufficient documentation

## 2024-05-07 DIAGNOSIS — R197 Diarrhea, unspecified: Secondary | ICD-10-CM | POA: Insufficient documentation

## 2024-05-07 LAB — CBC WITH DIFFERENTIAL/PLATELET
Abs Immature Granulocytes: 0 K/uL (ref 0.00–0.07)
Basophils Absolute: 0 K/uL (ref 0.0–0.1)
Basophils Relative: 1 %
Eosinophils Absolute: 0 K/uL (ref 0.0–0.5)
Eosinophils Relative: 1 %
HCT: 42.7 % (ref 39.0–52.0)
Hemoglobin: 14.7 g/dL (ref 13.0–17.0)
Immature Granulocytes: 0 %
Lymphocytes Relative: 26 %
Lymphs Abs: 1.5 K/uL (ref 0.7–4.0)
MCH: 31.2 pg (ref 26.0–34.0)
MCHC: 34.4 g/dL (ref 30.0–36.0)
MCV: 90.7 fL (ref 80.0–100.0)
Monocytes Absolute: 0.5 K/uL (ref 0.1–1.0)
Monocytes Relative: 8 %
Neutro Abs: 3.9 K/uL (ref 1.7–7.7)
Neutrophils Relative %: 64 %
Platelets: 277 K/uL (ref 150–400)
RBC: 4.71 MIL/uL (ref 4.22–5.81)
RDW: 12.2 % (ref 11.5–15.5)
WBC: 6 K/uL (ref 4.0–10.5)
nRBC: 0 % (ref 0.0–0.2)

## 2024-05-07 LAB — COMPREHENSIVE METABOLIC PANEL WITH GFR
ALT: 13 U/L (ref 0–44)
AST: 15 U/L (ref 15–41)
Albumin: 4.7 g/dL (ref 3.5–5.0)
Alkaline Phosphatase: 70 U/L (ref 38–126)
Anion gap: 12 (ref 5–15)
BUN: 14 mg/dL (ref 6–20)
CO2: 23 mmol/L (ref 22–32)
Calcium: 10 mg/dL (ref 8.9–10.3)
Chloride: 102 mmol/L (ref 98–111)
Creatinine, Ser: 1 mg/dL (ref 0.61–1.24)
GFR, Estimated: >60 mL/min (ref >60)
Glucose, Bld: 96 mg/dL (ref 70–99)
Potassium: 3.8 mmol/L (ref 3.5–5.1)
Sodium: 137 mmol/L (ref 135–145)
Total Bilirubin: 1 mg/dL (ref 0.0–1.2)
Total Protein: 6.9 g/dL (ref 6.5–8.1)

## 2024-05-07 LAB — LIPASE, BLOOD: Lipase: 32 U/L (ref 11–51)

## 2024-05-07 MED ORDER — DICYCLOMINE HCL 20 MG PO TABS: 20.0000 mg | ORAL_TABLET | Freq: Two times a day (BID) | ORAL | 0 refills | Status: DC

## 2024-05-07 MED ORDER — DICYCLOMINE HCL 20 MG PO TABS: 20.0000 mg | ORAL_TABLET | Freq: Two times a day (BID) | ORAL | 1 refills | Status: DC

## 2024-05-07 NOTE — ED Triage Notes (Signed)
 Pt reports starting In Jan would have issues with abdominal pains on left side and diarrhea around when had sciatic issues. Reports diarrhea esp after drinking so stopped. Would have n/v and shaking some days.  Reports since has tried to Cut out things in diet to see if symptoms resolved.  Pt reports went on clear liquid diet on Sunday and when tried to eat omelet today had diarrhea. Reports he believes has diverticulitis.  Tried taking Omeprazole for 3 weeks to see if would help but didn't so stopped.  Pt denies having a PCP.

## 2024-05-07 NOTE — ED Provider Notes (Signed)
 [facility]    CSN: [id number] Arrival date & time: 05/07/24  1241      History   Chief Complaint Chief Complaint  Patient presents with   Diarrhea   Abdominal Pain    HPI Craig Barnes is a 33 y.o. male.   Patient presents today with a 8 to 3-month history of intermittent abdominal pain with associated diarrhea.  He reports that symptoms began after an episode of what he assumes is gastroenteritis where he had nausea, vomiting, diarrhea.  His symptoms gradually improved but he then continued to have intermittent abdominal cramping with associated diarrhea.  He has also had some associated left flank and back pain but this has not been terribly painful.  He has status post appendectomy but has not had additional abdominal surgeries.  He denies history of gastrointestinal disorder including ulcerative colitis, Crohn's disease, irritable bowel.  He does notice that stress tends to exacerbate his symptoms.  He reports intense cramping with severe pain that is relieved within a few minutes of having a bowel movement.  Denies any fever, nausea, vomiting, melena, hematochezia.  He has tried dietary restriction which provides only minimal improvement of symptoms.  He has never seen a gastroenterologist or had advanced imaging or endoscopic procedure.  He has tried omeprazole but this was ineffective so he discontinued this medicine.    History reviewed. No pertinent past medical history.  There are no active problems to display for this patient.   Past Surgical History:  Procedure Laterality Date   APPENDECTOMY     ORIF PATELLA Left 10/06/2019   Procedure: OPEN REDUCTION INTERNAL (ORIF) FIXATION PATELLA;  Surgeon: Ernie Cough, MD;  Location: [facility];  Service: Orthopedics;  Laterality: Left;  90 mins       Home Medications    Prior to Admission medications   Medication Sig Start Date End Date Taking? Authorizing Provider  dicyclomine  (BENTYL ) 20 MG tablet Take 1 tablet  (20 mg total) by mouth 2 (two) times daily. 05/07/24  Yes Dolph Tavano K, PA-C  HYDROcodone-acetaminophen  (NORCO) 7.5-325 MG tablet Take 1 tablet by mouth every 6 (six) hours as needed for moderate pain.    [provider]  ketorolac  (TORADOL ) 10 MG tablet Take 1 tablet (10 mg total) by mouth every 6 (six) hours as needed. 05/16/21   Graham, Laura E, PA-C  Lactulose  20 GM/30ML SOLN Take 30 mLs (20 g total) by mouth 2 (two) times daily as needed (for constipation). 10/11/19   Rolinda Rogue, MD  tiZANidine  (ZANAFLEX ) 2 MG tablet Take 1 tablet (2 mg total) by mouth every 8 (eight) hours as needed for muscle spasms. 05/16/21   Graham, Laura E, PA-C  fluticasone  (FLONASE ) 50 MCG/ACT nasal spray Place 2 sprays into both nostrils daily. 03/09/19 09/30/19  Babara Greig GAILS, PA-C    Family History Family History  Problem Relation Age of Onset   Healthy Mother     Social History Social History   Tobacco Use   Smoking status: Former    Current packs/day: 0.00    Types: Cigarettes    Quit date: 2018    Years since quitting: 7.6   Smokeless tobacco: Never  Vaping Use   Vaping status: Every Day   Substances: Nicotine, Flavoring  Substance Use Topics   Alcohol use: Yes    Comment: 2-3x/wk   Drug use: Yes    Types: Marijuana     Allergies   Amoxicillin   Review of Systems Review of Systems  Constitutional:  Positive for activity change. Negative for appetite change, fatigue and fever.  Gastrointestinal:  Positive for abdominal pain and diarrhea. Negative for blood in stool, constipation, nausea and vomiting.  Neurological:  Negative for dizziness, light-headedness and headaches.     Physical Exam Triage Vital Signs ED Triage Vitals  Encounter Vitals Group     BP 05/07/24 1303 135/80     Girls Systolic BP Percentile --      Girls Diastolic BP Percentile --      Boys Systolic BP Percentile --      Boys Diastolic BP Percentile --      Pulse Rate 05/07/24 1303 69     Resp 05/07/24  1303 14     Temp 05/07/24 1303 98.5 F (36.9 C)     Temp Source 05/07/24 1303 Oral     SpO2 05/07/24 1303 97 %     Weight --      Height --      Head Circumference --      Peak Flow --      Pain Score 05/07/24 1302 5     Pain Loc --      Pain Education --      Exclude from Growth Chart --    No data found.  Updated Vital Signs BP 135/80 (BP Location: Right Arm)   Pulse 69   Temp 98.5 F (36.9 C) (Oral)   Resp 14   SpO2 97%   Visual Acuity Right Eye Distance:   Left Eye Distance:   Bilateral Distance:    Right Eye Near:   Left Eye Near:    Bilateral Near:     Physical Exam Vitals reviewed.  Constitutional:      General: He is awake.     Appearance: Normal appearance. He is well-developed. He is not ill-appearing.     Comments: Very pleasant male appears stated age in no acute distress sitting comfortably in exam room  HENT:     Head: Normocephalic and atraumatic.     Mouth/Throat:     Pharynx: Uvula midline. No oropharyngeal exudate or posterior oropharyngeal erythema.  Cardiovascular:     Rate and Rhythm: Normal rate and regular rhythm.     Heart sounds: Normal heart sounds, S1 normal and S2 normal. No murmur heard. Pulmonary:     Effort: Pulmonary effort is normal.     Breath sounds: Normal breath sounds. No stridor. No wheezing, rhonchi or rales.     Comments: Clear to auscultation bilaterally Abdominal:     General: Bowel sounds are normal.     Palpations: Abdomen is soft.     Tenderness: There is no abdominal tenderness. There is no right CVA tenderness, left CVA tenderness, guarding or rebound.     Comments: Benign abdominal exam.  No tenderness palpation.  No evidence of acute abdomen on physical exam.  Neurological:     Mental Status: He is alert.  Psychiatric:        Behavior: Behavior is cooperative.      UC Treatments / Results  Labs (all labs ordered are listed, but only abnormal results are displayed) Labs Reviewed  CBC WITH  DIFFERENTIAL/PLATELET  COMPREHENSIVE METABOLIC PANEL WITH GFR  LIPASE, BLOOD    EKG   Radiology No results found.  Procedures Procedures (including critical care time)  Medications Ordered in UC Medications - No data to display  Initial Impression / Assessment and Plan / UC Course  I have reviewed the triage vital signs  and the nursing notes.  Pertinent labs & imaging results that were available during my care of the patient were reviewed by me and considered in my medical decision making (see chart for details).     Patient is well-appearing, afebrile, nontoxic, nontachycardic.  No evidence of acute abdomen on physical exam that warrant emergent evaluation or imaging.  I am concerned for irritable bowel given his clinical presentation.  Will start dicyclomine  20 mg twice daily to help manage his symptoms.  Also recommended FODMAP diet to help with symptomatic management.  Basic lab work including CBC, CMP, lipase obtained today given his ongoing symptoms we will contact him if these are abnormal and change our treatment plan.  I did recommend he follow-up with a gastroenterologist and was given the contact information for local provider with instruction to call to schedule an appointment.  Discussed that if anything worsens or changes he needs to be seen emergently.  Strict return precautions given.  All questions answered to patient's satisfaction.  Final Clinical Impressions(s) / UC Diagnoses   Final diagnoses:  Generalized abdominal pain  Diarrhea, unspecified type     Discharge Instructions      Start dicyclomine  twice a day to help with abdominal cramping and diarrhea.  Some people find a low FODMAP diet to be helpful in managing the symptoms.  I have attached some of that information in your after visit summary for review.  Follow-up with gastroenterology; call to schedule an appointment.  I will call you if any of your blood work is abnormal.  If you have severe abdominal  pain, fever, blood in your stool, nausea/vomiting interfering with oral intake, weakness, weight loss you need to go to the emergency room immediately.     ED Prescriptions     Medication Sig Dispense Auth. Provider   dicyclomine  (BENTYL ) 20 MG tablet Take 1 tablet (20 mg total) by mouth 2 (two) times daily. 30 tablet Coleman Kalas K, PA-C      PDMP not reviewed this encounter.   Sherrell Rocky POUR, PA-C 05/07/24 1348

## 2024-05-07 NOTE — Discharge Instructions (Signed)
 Start dicyclomine  twice a day to help with abdominal cramping and diarrhea.  Some people find a low FODMAP diet to be helpful in managing the symptoms.  I have attached some of that information in your after visit summary for review.  Follow-up with gastroenterology; call to schedule an appointment.  I will call you if any of your blood work is abnormal.  If you have severe abdominal pain, fever, blood in your stool, nausea/vomiting interfering with oral intake, weakness, weight loss you need to go to the emergency room immediately.

## 2024-05-07 NOTE — Telephone Encounter (Signed)
 Patient contacted us  requesting that we provide an additional month of this medication as he will not be able to see the gastroenterologist until November.  Additional quantity sent to pharmacy per patient request.

## 2024-07-01 ENCOUNTER — Ambulatory Visit: Payer: Self-pay | Admitting: Gastroenterology

## 2024-07-01 ENCOUNTER — Encounter: Payer: Self-pay | Admitting: Gastroenterology

## 2024-07-01 VITALS — BP 110/70 | HR 70 | Ht 72.0 in | Wt 138.0 lb

## 2024-07-01 DIAGNOSIS — R1084 Generalized abdominal pain: Secondary | ICD-10-CM | POA: Insufficient documentation

## 2024-07-01 DIAGNOSIS — R194 Change in bowel habit: Secondary | ICD-10-CM | POA: Insufficient documentation

## 2024-07-01 MED ORDER — DICYCLOMINE HCL 20 MG PO TABS: 20.0000 mg | ORAL_TABLET | Freq: Two times a day (BID) | ORAL | 2 refills | Status: AC

## 2024-07-01 NOTE — Progress Notes (Signed)
 07/01/2024 ADEEL GUIFFRE [id number] 12/22/1990   Discussed the use of AI scribe software for clinical note transcription with the patient, who gave verbal consent to proceed.  History of Present Illness Craig Barnes is a 33 year old male who presents with gastrointestinal distress.  He has been experiencing gastrointestinal distress since January or February, following an episode of stomach flu with severe vomiting lasting eight hours and diarrhea for two days. Since then, he has had erratic gastrointestinal symptoms, including alternating episodes of diarrhea and constipation. Initially, diarrhea occurred once a day, progressing to persistent diarrhea throughout the day, followed by constipation lasting two to three days.  He describes significant malaise, nausea, and general abdominal pain accompanying these symptoms. He has been prescribed dicyclomine , which he takes twice daily, and has attempted a low FODMAP diet. Dairy products exacerbate his symptoms significantly, while gluten has a variable impact. He finds relief by consuming a bland diet of chicken and rice when symptoms worsen.  Seen at UC where CBC, CMP, and lipase were normal.  Was given dicyclomine .  Dicyclomine  helps manage his symptoms, but discontinuation leads to a quick return of symptoms. He experiences solid stools while on dicyclomine .  Notes feeling lightheaded and uncomfortable before and after bowel movements, regardless of stool consistency. He is often woken up at night by abdominal pain, which is sometimes relieved by a bowel movement.  No blood in his stool, although he notes occasional dark stools, which he attributes to dietary factors such as red wine. He has lost 15 to 20 pounds unintentionally since the onset of his symptoms.  His family history is notable for his mother being diagnosed with IBS in her sixties. He has no known family history of Crohn's disease or celiac disease. 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 He has attempted dietary modifications, including going dairy-free, which has provided some relief.   Expressed concern about cost due to being uninsured.   History reviewed. No pertinent past medical history. Past Surgical History:  Procedure Laterality Date   APPENDECTOMY     ORIF PATELLA Left 10/06/2019   Procedure: OPEN REDUCTION INTERNAL (ORIF) FIXATION PATELLA;  Surgeon: Ernie Cough, MD;  Location: [facility];  Service: Orthopedics;  Laterality: Left;  90 mins    reports that he quit smoking about 7 years ago. His smoking use included cigarettes. He has never used smokeless tobacco. He reports current alcohol use. He reports current drug use. Drug: Marijuana. family history includes Cancer in his father; Healthy in his mother. Allergies  Allergen Reactions   Amoxicillin Itching and Rash    Did it involve swelling of the face/tongue/throat, SOB, or low BP? No Did it involve sudden or severe rash/hives, skin peeling, or any reaction on the inside of your mouth or nose? No Did you need to seek medical attention at a hospital or doctor's office? Yes When did it last happen? ~2013 If all above answers are "NO", may proceed with cephalosporin use.        Outpatient Encounter Medications as of 07/01/2024  Medication Sig   dicyclomine  (BENTYL ) 20 MG tablet Take 1 tablet (20 mg total) by mouth 2 (two) times daily.   [DISCONTINUED] fluticasone  (FLONASE ) 50 MCG/ACT nasal spray Place 2 sprays into both nostrils daily.   [DISCONTINUED] HYDROcodone-acetaminophen  (NORCO) 7.5-325 MG tablet Take 1 tablet by mouth every 6 (six) hours as needed for moderate pain.   [DISCONTINUED] ketorolac  (TORADOL ) 10 MG  tablet Take 1 tablet (10 mg total) by mouth every 6 (six) hours as needed.   [DISCONTINUED] Lactulose  20 GM/30ML SOLN Take 30 mLs (20 g total) by mouth 2 (two) times daily as needed (for constipation).   [DISCONTINUED] tiZANidine   (ZANAFLEX ) 2 MG tablet Take 1 tablet (2 mg total) by mouth every 8 (eight) hours as needed for muscle spasms.   No facility-administered encounter medications on file as of 07/01/2024.     REVIEW OF SYSTEMS  : All other systems reviewed and negative except where noted in the History of Present Illness.   PHYSICAL EXAM: BP 110/70   Pulse 70   Ht 6' (1.829 m)   Wt 138 lb (62.6 kg)   BMI 18.72 kg/m  General: Well developed white male in no acute distress Head: Normocephalic and atraumatic Eyes:  Sclerae anicteric, conjunctiva pink. Ears: Normal auditory acuity Lungs: Clear throughout to auscultation; no W/R/R. Heart: Regular rate and rhythm; no M/R/G. Abdomen: Soft, non-distended.  BS present.  No significant TTP. Musculoskeletal: Symmetrical with no gross deformities  Skin: No lesions on visible extremities Extremities: No edema  Neurological: Alert oriented x 4, grossly non-focal Psychological:  Alert and cooperative. Normal mood and affect Assessment & Plan Chronic gastrointestinal symptoms with alternating diarrhea and constipation, abdominal pain, and nausea Chronic gastrointestinal symptoms since January or February, following an episode of stomach flu. Symptoms include alternating diarrhea and constipation, abdominal pain, and nausea. Differential diagnosis includes post-infectious irritable bowel syndrome, inflammatory bowel disease (Crohn's disease, ulcerative colitis), celiac disease, and lactose intolerance. Previous basic blood work was normal. Dicyclomine  provides symptomatic relief. No blood in stool, but occasional dark stools likely dietary. No family history of chronic gastrointestinal diseases except for maternal IBS diagnosis in her sixties. Discussed potential for post-infectious irritable bowel syndrome and inflammatory bowel disease. Fecal calprotectin test recommended to rule out inflammatory bowel disease. CT scan and colonoscopy considered if fecal calprotectin is  abnormal. Discussed cost considerations and insurance implications for further testing as he was concerned since he does not have insurance. - Ordered fecal calprotectin test to assess for inflammation in the GI tract. - Continue dicyclomine  for symptomatic relief.  New prescription sent to pharmacy. - Start Benefiber, two teaspoons daily, to help regulate bowel habits. - Consider CT scan and/or colonoscopy if fecal calprotectin is abnormal. - Offered celiac testing but he said that he does not think gluten is an issue anda declined testing.  Lactose intolerance Confirmed by symptoms of gastrointestinal distress following dairy consumption. Symptoms improve with avoidance of dairy products. Discussed the importance of avoiding all dairy products, including butter, cream soups, and dressings. - Continue avoidance of dairy products.  Unintentional weight loss Reported unintentional weight loss of 15 to 20 pounds. No specific cause identified, but may be related to chronic gastrointestinal symptoms and dietary changes. Discussed the importance of monitoring weight and nutritional intake. -Consider CT scan as above.    His care is being assigned to Dr. Legrand.   CC:  No ref. provider found

## 2024-07-01 NOTE — Patient Instructions (Signed)
 Start Benefiber 2 teaspoons in 8 ounces of liquid daily  We have sent the following medications to your pharmacy for you to pick up at your convenience: Dicyclomine  20 mg. twice daily  Your provider has requested that you go to the basement level for lab work before leaving today. Press B on the elevator. The lab is located at the first door on the left as you exit the elevator.

## 2024-07-02 ENCOUNTER — Other Ambulatory Visit: Payer: Self-pay

## 2024-07-02 DIAGNOSIS — R1084 Generalized abdominal pain: Secondary | ICD-10-CM

## 2024-07-02 DIAGNOSIS — R194 Change in bowel habit: Secondary | ICD-10-CM

## 2024-07-03 NOTE — Progress Notes (Signed)
 ____________________________________________________________  Attending physician addendum:  Thank you for sending this case to me. I have reviewed the entire note and agree with the plan.  Reassuring labs at urgent care on 05/07/2024.  Agree with further workup as outlined.  Victory Brand, MD  ____________________________________________________________

## 2024-07-04 ENCOUNTER — Ambulatory Visit: Payer: Self-pay | Admitting: Gastroenterology

## 2024-07-04 LAB — CALPROTECTIN, FECAL: Calprotectin, Fecal: <5 ug/g (ref 0–120)
